# Patient Record
Sex: Female | Born: 1995
Health system: Southern US, Community
[De-identification: ages and names within clinical notes are randomized; demographics above are authoritative.]

## PROBLEM LIST (undated history)

## (undated) DIAGNOSIS — Z789 Other specified health status: Secondary | ICD-10-CM

## (undated) HISTORY — PX: NO PAST SURGERIES: SHX2092

---

## 2019-10-09 LAB — OB RESULTS CONSOLE GBS: GBS: NEGATIVE

## 2020-02-02 ENCOUNTER — Other Ambulatory Visit: Payer: Self-pay

## 2020-02-02 ENCOUNTER — Encounter (HOSPITAL_COMMUNITY): Payer: Self-pay | Admitting: Obstetrics and Gynecology

## 2020-02-02 ENCOUNTER — Inpatient Hospital Stay (HOSPITAL_COMMUNITY)
Admission: AD | Admit: 2020-02-02 | Discharge: 2020-02-02 | Disposition: A | Discharge status: Home / Self Care | Payer: Self-pay | Attending: Obstetrics and Gynecology | Admitting: Obstetrics and Gynecology

## 2020-02-02 ENCOUNTER — Inpatient Hospital Stay (HOSPITAL_COMMUNITY): Payer: Self-pay

## 2020-02-02 DIAGNOSIS — O209 Hemorrhage in early pregnancy, unspecified: Secondary | ICD-10-CM

## 2020-02-02 DIAGNOSIS — Z3A10 10 weeks gestation of pregnancy: Secondary | ICD-10-CM

## 2020-02-02 DIAGNOSIS — O208 Other hemorrhage in early pregnancy: Secondary | ICD-10-CM | POA: Insufficient documentation

## 2020-02-02 DIAGNOSIS — O26891 Other specified pregnancy related conditions, first trimester: Secondary | ICD-10-CM | POA: Insufficient documentation

## 2020-02-02 DIAGNOSIS — O200 Threatened abortion: Secondary | ICD-10-CM

## 2020-02-02 HISTORY — DX: Other specified health status: Z78.9

## 2020-02-02 LAB — URINALYSIS, ROUTINE W REFLEX MICROSCOPIC
Bilirubin Urine: NEGATIVE
Glucose, UA: NEGATIVE mg/dL
Ketones, ur: NEGATIVE mg/dL
Leukocytes,Ua: NEGATIVE
Nitrite: NEGATIVE
Protein, ur: NEGATIVE mg/dL
Specific Gravity, Urine: 1.01 (ref 1.005–1.030)
pH: 6 (ref 5.0–8.0)

## 2020-02-02 LAB — CBC
HCT: 40.2 % (ref 36.0–46.0)
Hemoglobin: 13.3 g/dL (ref 12.0–15.0)
MCH: 32 pg (ref 26.0–34.0)
MCHC: 33.1 g/dL (ref 30.0–36.0)
MCV: 96.6 fL (ref 80.0–100.0)
Platelets: 225 10*3/uL (ref 150–400)
RBC: 4.16 MIL/uL (ref 3.87–5.11)
RDW: 11.9 % (ref 11.5–15.5)
WBC: 11.7 10*3/uL — ABNORMAL HIGH (ref 4.0–10.5)
nRBC: 0 % (ref 0.0–0.2)

## 2020-02-02 LAB — ABO/RH: ABO/RH(D): O POS

## 2020-02-02 LAB — WET PREP, GENITAL
Clue Cells Wet Prep HPF POC: NONE SEEN
Sperm: NONE SEEN
Trich, Wet Prep: NONE SEEN
Yeast Wet Prep HPF POC: NONE SEEN

## 2020-02-02 LAB — HCG, QUANTITATIVE, PREGNANCY: hCG, Beta Chain, Quant, S: 23902 m[IU]/mL — ABNORMAL HIGH (ref <5)

## 2020-02-02 LAB — POCT PREGNANCY, URINE: Preg Test, Ur: POSITIVE — AB

## 2020-02-02 NOTE — MAU Provider Note (Signed)
Chief Complaint: Vaginal Bleeding   First Provider Initiated Contact with Patient 02/02/20 1901     SUBJECTIVE HPI: Melanie Maldonado is a 24 y.o. G1P0 at [redacted]w[redacted]d who presents to Maternity Admissions reporting vaginal bleeding and abdominal pain.  Symptoms started this afternoon.  Bleeding enough to wear a pad, but not saturating pads or passing clots.  Has not been seen anywhere with this pregnancy yet.  Location: abdomen Quality: cramping Severity: 8/10 on pain scale Duration: hours Timing: intermittent Modifying factors: none Associated signs and symptoms: vaginal bleeding  Past Medical History:  Diagnosis Date  . Medical history non-contributory    OB History  Gravida Para Term Preterm AB Living  1            SAB TAB Ectopic Multiple Live Births               # Outcome Date GA Lbr Len/2nd Weight Sex Delivery Anes PTL Lv  1 Current            Past Surgical History:  Procedure Laterality Date  . NO PAST SURGERIES     Social History   Socioeconomic History  . Marital status: Single    Spouse name: Not on file  . Number of children: Not on file  . Years of education: Not on file  . Highest education level: Not on file  Occupational History  . Not on file  Tobacco Use  . Smoking status: Never Smoker  . Smokeless tobacco: Never Used  Substance and Sexual Activity  . Alcohol use: Never  . Drug use: Never  . Sexual activity: Yes    Birth control/protection: None  Other Topics Concern  . Not on file  Social History Narrative  . Not on file   Social Determinants of Health   Financial Resource Strain:   . Difficulty of Paying Living Expenses:   Food Insecurity:   . Worried About Charity fundraiser in the Last Year:   . Arboriculturist in the Last Year:   Transportation Needs:   . Film/video editor (Medical):   Marland Kitchen Lack of Transportation (Non-Medical):   Physical Activity:   . Days of Exercise per Week:   . Minutes of Exercise per Session:   Stress:   . Feeling  of Stress :   Social Connections:   . Frequency of Communication with Friends and Family:   . Frequency of Social Gatherings with Friends and Family:   . Attends Religious Services:   . Active Member of Clubs or Organizations:   . Attends Archivist Meetings:   Marland Kitchen Marital Status:   Intimate Partner Violence:   . Fear of Current or Ex-Partner:   . Emotionally Abused:   Marland Kitchen Physically Abused:   . Sexually Abused:    History reviewed. No pertinent family history. No current facility-administered medications on file prior to encounter.   No current outpatient medications on file prior to encounter.   No Known Allergies  I have reviewed patient's Past Medical Hx, Surgical Hx, Family Hx, Social Hx, medications and allergies.   Review of Systems  Constitutional: Negative.   Gastrointestinal: Positive for abdominal pain. Negative for diarrhea, nausea and vomiting.  Genitourinary: Positive for vaginal bleeding.    OBJECTIVE Patient Vitals for the past 24 hrs:  BP Temp Temp src Pulse Resp SpO2 Weight  02/02/20 1749 (!) 98/50 98.2 F (36.8 C) Oral 66 15 100 % 47.7 kg   Constitutional: Well-developed, well-nourished female in no  acute distress.  Cardiovascular: normal rate & rhythm, no murmur Respiratory: normal rate and effort. Lung sounds clear throughout GI: Abd soft, non-tender, Pos BS x 4. No guarding or rebound tenderness MS: Extremities nontender, no edema, normal ROM Neurologic: Alert and oriented x 4.  GU:     SPECULUM EXAM: NEFG, small amount of dark red blood  BIMANUAL: No CMT. cervix closed; uterus normal size, no adnexal tenderness or masses.    LAB RESULTS Results for orders placed or performed during the hospital encounter of 02/02/20 (from the past 24 hour(s))  Urinalysis, Routine w reflex microscopic     Status: Abnormal   Collection Time: 02/02/20  5:31 PM  Result Value Ref Range   Color, Urine STRAW (A) YELLOW   APPearance CLEAR CLEAR   Specific  Gravity, Urine 1.010 1.005 - 1.030   pH 6.0 5.0 - 8.0   Glucose, UA NEGATIVE NEGATIVE mg/dL   Hgb urine dipstick LARGE (A) NEGATIVE   Bilirubin Urine NEGATIVE NEGATIVE   Ketones, ur NEGATIVE NEGATIVE mg/dL   Protein, ur NEGATIVE NEGATIVE mg/dL   Nitrite NEGATIVE NEGATIVE   Leukocytes,Ua NEGATIVE NEGATIVE   RBC / HPF 0-5 0 - 5 RBC/hpf   WBC, UA 0-5 0 - 5 WBC/hpf   Bacteria, UA RARE (A) NONE SEEN   Squamous Epithelial / LPF 0-5 0 - 5   Mucus PRESENT   Pregnancy, urine POC     Status: Abnormal   Collection Time: 02/02/20  5:34 PM  Result Value Ref Range   Preg Test, Ur POSITIVE (A) NEGATIVE  CBC     Status: Abnormal   Collection Time: 02/02/20  6:44 PM  Result Value Ref Range   WBC 11.7 (H) 4.0 - 10.5 K/uL   RBC 4.16 3.87 - 5.11 MIL/uL   Hemoglobin 13.3 12.0 - 15.0 g/dL   HCT 84.6 96.2 - 95.2 %   MCV 96.6 80.0 - 100.0 fL   MCH 32.0 26.0 - 34.0 pg   MCHC 33.1 30.0 - 36.0 g/dL   RDW 84.1 32.4 - 40.1 %   Platelets 225 150 - 400 K/uL   nRBC 0.0 0.0 - 0.2 %  ABO/Rh     Status: None   Collection Time: 02/02/20  6:44 PM  Result Value Ref Range   ABO/RH(D) O POS    No rh immune globuloin      NOT A RH IMMUNE GLOBULIN CANDIDATE, PT RH POSITIVE Performed at [facility], [address] [place], [place] [address]   hCG, quantitative, pregnancy     Status: Abnormal   Collection Time: 02/02/20  6:44 PM  Result Value Ref Range   hCG, Beta Chain, Quant, S 23,902 (H) <5 mIU/mL  Wet prep, genital     Status: Abnormal   Collection Time: 02/02/20  7:37 PM   Specimen: Cervix  Result Value Ref Range   Yeast Wet Prep HPF POC NONE SEEN NONE SEEN   Trich, Wet Prep NONE SEEN NONE SEEN   Clue Cells Wet Prep HPF POC NONE SEEN NONE SEEN   WBC, Wet Prep HPF POC MANY (A) NONE SEEN   Sperm NONE SEEN     IMAGING US OB LESS THAN 14 WEEKS WITH OB TRANSVAGINAL  Result Date: 02/02/2020 CLINICAL DATA:  Bleeding and pain EXAM: OBSTETRIC <14 WK [place] AND TRANSVAGINAL OB US TECHNIQUE: Both  transabdominal and transvaginal ultrasound examinations were performed for complete evaluation of the gestation as well as the maternal uterus, adnexal regions, and pelvic cul-de-sac.  Transvaginal technique was performed to assess early pregnancy. COMPARISON:  None. FINDINGS: Intrauterine gestational sac: Single Yolk sac:  Visualized Embryo:  Not visualized Cardiac Activity: Not visualized MSD: 12.8 mm   6 w   0 d Subchorionic hemorrhage:  Along the right fundal portion of the sac. Maternal uterus/adnexae: Ovaries are within normal limits. The left ovary measures 2.5 x 3 x 1.4 cm and contains corpus luteum. The right ovary measures 2.2 x 2.5 x 1.1 cm. Trace free fluid IMPRESSION: 1. Single IUP with visible gestational sac and yolk sac but no embryo. Consider follow-up ultrasound in 10-14 days to confirm viability. 2. Small subchorionic hemorrhage 3. Trace free fluid in the pelvis Electronically Signed   By: Jasmine Pang M.D.   On: 02/02/2020 20:17    MAU COURSE Orders Placed This Encounter  Procedures  . Wet prep, genital  . US OB LESS THAN 14 WEEKS WITH OB TRANSVAGINAL  . US OB Transvaginal  . Urinalysis, Routine w reflex microscopic  . CBC  . hCG, quantitative, pregnancy  . Pregnancy, urine POC  . ABO/Rh  . Discharge patient   No orders of the defined types were placed in this encounter.   MDM +UPT UA, wet prep, GC/chlamydia, CBC, ABO/Rh, quant hCG, and [place] today to rule out ectopic pregnancy which can be life threatening.   RH positive  Ultrasound shows intrauterine gestational sac with yolk sac and a small subchorionic hemorrhage Discussed with patient this pregnancy is either earlier than suspected or it is a failed pregnancy based off her gestational age by LMP.  Will get follow-up ultrasound in 7+ days to assess for growth. ASSESSMENT 1. Threatened miscarriage   2. Vaginal bleeding in pregnancy, first trimester   3. Abdominal pain during pregnancy in first trimester      PLAN Discharge home in stable condition. Bleeding precautions GC/cT pending Outpatient ultrasound ordered for viability   Follow-up Information    [facility].   Specialty: Radiology Contact information: [address] [place] [address] [phone]         Allergies as of 02/02/2020   No Known Allergies     Medication List    You have not been prescribed any medications.      Judeth Horn, NP 02/02/2020  8:40 PM

## 2020-02-02 NOTE — Discharge Instructions (Signed)
Threatened Miscarriage  A threatened miscarriage is when you have bleeding from your vagina during the first 20 weeks of pregnancy but the pregnancy does not end. Your doctor will do tests to make sure you are still pregnant. The cause of the bleeding may not be known. This condition does not mean your pregnancy will end, but it does increase the risk that it will end (complete miscarriage). Follow these instructions at home:  Get plenty of rest.  If you have bleeding in your vagina, do not have sex or use tampons.  Do not douche.  Do not smoke or use drugs.  Do not drink alcohol.  Avoid caffeine.  Keep all follow-up prenatal visits as told by your doctor. This is important. Contact a doctor if:  You have light bleeding from your vagina.  You have belly pain or cramping.  You have a fever. Get help right away if:  You have heavy bleeding from your vagina.  You have clots of blood coming from your vagina.  You pass tissue from your vagina.  You have a gush of fluid from your vagina.  You are leaking fluid from your vagina.  You have very bad pain or cramps in your low back or belly.  You have fever, chills, and very bad belly pain. Summary  A threatened miscarriage is when you have bleeding from your vagina during the first 20 weeks of pregnancy but the pregnancy does not end.  This condition does not mean your pregnancy will end, but it does increase the risk that it will end (complete miscarriage).  Get plenty of rest. If you have bleeding in your vagina, do not have sex or use tampons.  Keep all follow-up prenatal visits as told by your doctor. This is important. This information is not intended to replace advice given to you by your health care provider. Make sure you discuss any questions you have with your health care provider. Document Revised: 10/04/2017 Document Reviewed: 11/24/2016 Elsevier Patient Education  2020 Elsevier Inc.  

## 2020-02-02 NOTE — MAU Note (Signed)
Pt noticed some vaginal bleeding when she went to the bathroom a couple of hours ago and again when she used the restroom here. States it was bright red, not quite as heavy as a period. Has had lower abdominal cramping on and off for the last four days. Denies recent intercourse.

## 2020-02-03 LAB — GC/CHLAMYDIA PROBE AMP (~~LOC~~) NOT AT ARMC
Chlamydia: NEGATIVE
Comment: NEGATIVE
Comment: NORMAL
Neisseria Gonorrhea: NEGATIVE

## 2020-02-10 ENCOUNTER — Other Ambulatory Visit: Payer: Self-pay

## 2020-02-10 ENCOUNTER — Ambulatory Visit (INDEPENDENT_AMBULATORY_CARE_PROVIDER_SITE_OTHER): Payer: Self-pay | Admitting: *Deleted

## 2020-02-10 ENCOUNTER — Ambulatory Visit
Admission: RE | Admit: 2020-02-10 | Discharge: 2020-02-10 | Disposition: A | Discharge status: Home / Self Care | Payer: Self-pay | Source: Ambulatory Visit | Attending: Student | Admitting: Student

## 2020-02-10 VITALS — BP 93/60 | HR 58 | Temp 97.7°F

## 2020-02-10 DIAGNOSIS — O200 Threatened abortion: Secondary | ICD-10-CM | POA: Insufficient documentation

## 2020-02-10 DIAGNOSIS — O26891 Other specified pregnancy related conditions, first trimester: Secondary | ICD-10-CM | POA: Insufficient documentation

## 2020-02-10 DIAGNOSIS — R109 Unspecified abdominal pain: Secondary | ICD-10-CM | POA: Insufficient documentation

## 2020-02-10 DIAGNOSIS — Z712 Person consulting for explanation of examination or test findings: Secondary | ICD-10-CM

## 2020-02-10 DIAGNOSIS — O209 Hemorrhage in early pregnancy, unspecified: Secondary | ICD-10-CM | POA: Insufficient documentation

## 2020-02-10 NOTE — Progress Notes (Signed)
Pt here for [place] results. She denies having any vaginal bleeding or pain. [place] results were reviewed with Raelyn Mora, CNM. Pt informed of viable intrauterine pregnancy @ [redacted]w[redacted]d with EDD 09/30/2020.  Fetal heart rate is 127 bpm. Pictures provided by [place] dept were given to pt. Pt was advised to begin taking prenatal vitamins. Also she should return to MAU if she develops heavy vaginal bleeding or abdominal/pelvic pain. Pt voiced understanding of all information and instructions given. She will schedule prenatal care.

## 2020-02-10 NOTE — Progress Notes (Signed)
I have reviewed the chart and agree with nursing staff's documentation of this patient's encounter.  Raelyn Mora, CNM 02/10/2020 10:38 PM

## 2020-03-04 LAB — OB RESULTS CONSOLE RUBELLA ANTIBODY, IGM: Rubella: IMMUNE

## 2020-03-04 LAB — OB RESULTS CONSOLE HIV ANTIBODY (ROUTINE TESTING): HIV: NONREACTIVE

## 2020-03-04 LAB — OB RESULTS CONSOLE VARICELLA ZOSTER ANTIBODY, IGG: Varicella: NON-IMMUNE/NOT IMMUNE

## 2020-03-04 LAB — OB RESULTS CONSOLE HEPATITIS B SURFACE ANTIGEN: Hepatitis B Surface Ag: NEGATIVE

## 2020-03-04 LAB — HEPATITIS C ANTIBODY: HCV Ab: NEGATIVE

## 2020-03-05 ENCOUNTER — Other Ambulatory Visit: Payer: Self-pay | Admitting: Obstetrics & Gynecology

## 2020-03-05 DIAGNOSIS — Z369 Encounter for antenatal screening, unspecified: Secondary | ICD-10-CM

## 2020-03-25 ENCOUNTER — Ambulatory Visit: Payer: Self-pay | Attending: Obstetrics and Gynecology

## 2020-03-25 ENCOUNTER — Ambulatory Visit: Payer: Self-pay

## 2020-03-25 ENCOUNTER — Other Ambulatory Visit: Payer: Self-pay

## 2020-03-25 ENCOUNTER — Ambulatory Visit: Payer: Self-pay | Admitting: *Deleted

## 2020-03-25 VITALS — BP 100/58 | HR 91 | Ht 59.0 in | Wt 104.0 lb

## 2020-03-25 DIAGNOSIS — Z3682 Encounter for antenatal screening for nuchal translucency: Secondary | ICD-10-CM

## 2020-03-25 DIAGNOSIS — Z369 Encounter for antenatal screening, unspecified: Secondary | ICD-10-CM | POA: Insufficient documentation

## 2020-03-25 DIAGNOSIS — Z3A13 13 weeks gestation of pregnancy: Secondary | ICD-10-CM

## 2020-03-27 LAB — FIRST TRIMESTER SCREEN W/NT
CRL: 72.1 mm
DIA MoM: 1.24
DIA Value: 326.7 pg/mL
Gest Age-Collect: 13.1 weeks
Maternal Age At EDD: 26.2 yr
Nuchal Translucency MoM: 0.76
Nuchal Translucency: 1.2 mm
Number of Fetuses: 1
PAPP-A MoM: 1.47
PAPP-A Value: 2832.5 ng/mL
Test Results:: NEGATIVE
Weight: 104 [lb_av]
hCG MoM: 0.74
hCG Value: 79.3 IU/mL

## 2020-03-30 ENCOUNTER — Telehealth: Payer: Self-pay | Admitting: Genetic Counselor

## 2020-03-30 NOTE — Telephone Encounter (Signed)
LVM for Ms. Christians re: good news about screening results. Requested a call back to my direct line to discuss these in more detail, as no identifiers were provided in voicemail message.   Gershon Crane, MS, [facility] Genetic Counselor

## 2020-04-08 ENCOUNTER — Telehealth: Payer: Self-pay | Admitting: Genetic Counselor

## 2020-04-08 NOTE — Telephone Encounter (Signed)
Attempted to call Ms. Vadala again to discuss her negative first trimester screening results; however, I still did not receive a response. Left another voicemail informing her that I was calling with good news about her screening results and requested a call back to my direct line to discuss these in more detail.   Gershon Crane, MS, [facility] Genetic Counselor

## 2020-08-10 LAB — OB RESULTS CONSOLE HIV ANTIBODY (ROUTINE TESTING): HIV: NONREACTIVE

## 2020-08-10 LAB — OB RESULTS CONSOLE RPR: RPR: NONREACTIVE

## 2020-09-11 NOTE — L&D Delivery Note (Signed)
Delivery Note Melanie Maldonado is a 26 y.o. G1P0 at [redacted]w[redacted]d admitted for IOL d/t decreased fm.   GBS Status:  Negative/-- (01/28 0000) Maximum Maternal Temperature: 98.6  Labor course: Initial SVE: 1/70/-2. Augmentation with: Cytotec. She then progressed to complete.  ROM: 10h 33m with light mec fluid  Birth: At 2226 a viable female was delivered via spontaneous vaginal delivery (Presentation: LOA restituted to ROA ). Nuchal cord present: No.  Shoulders and body delivered in usual fashion. Infant placed directly on mom's abdomen for bonding/skin-to-skin, baby dried and stimulated. Cord clamped x 2 after 1 minute and cut by pt.  Cord blood collected.  The placenta separated spontaneously and delivered via gentle cord traction.  Pitocin infused rapidly IV per protocol.  Fundus firm with massage.  Placenta inspected and appears to be intact with a 3 VC.  Placenta/Cord with the following complications: marginal insertion .  Cord pH: not done Sponge and instrument count were correct x2.  Intrapartum complications:  None Anesthesia:  epidural Episiotomy: none Lacerations:  1st degree and and Rt labial, repaired for hemostasis Suture Repair: 3.0 vicryl rapide & 4.0 vicryl EBL (mL): 100   Infant: APGAR (1 MIN): 8   APGAR (5 MINS): 9   APGAR (10 MINS):    Infant weight: pending  Mom to postpartum.  Baby to Couplet care / Skin to Skin. Placenta to L&D   Plans to Breastfeed Contraception: outpatient Nexplanon Circumcision: N/A  Note sent to [facility]: N/A, [facility] pt   Cheral Marker CNM, [facility] 10/05/2020 10:54 PM

## 2020-10-05 ENCOUNTER — Inpatient Hospital Stay (HOSPITAL_COMMUNITY): Payer: Medicaid Other | Admitting: Anesthesiology

## 2020-10-05 ENCOUNTER — Inpatient Hospital Stay (HOSPITAL_COMMUNITY)
Admission: AD | Admit: 2020-10-05 | Discharge: 2020-10-07 | DRG: 805 | Disposition: A | Discharge status: Home / Self Care | Payer: Medicaid Other | Attending: Family Medicine | Admitting: Family Medicine

## 2020-10-05 ENCOUNTER — Encounter (HOSPITAL_COMMUNITY): Payer: Self-pay | Admitting: Obstetrics and Gynecology

## 2020-10-05 ENCOUNTER — Other Ambulatory Visit: Payer: Self-pay

## 2020-10-05 DIAGNOSIS — O368130 Decreased fetal movements, third trimester, not applicable or unspecified: Principal | ICD-10-CM | POA: Diagnosis present

## 2020-10-05 DIAGNOSIS — Z8759 Personal history of other complications of pregnancy, childbirth and the puerperium: Secondary | ICD-10-CM | POA: Diagnosis not present

## 2020-10-05 DIAGNOSIS — O43123 Velamentous insertion of umbilical cord, third trimester: Secondary | ICD-10-CM | POA: Diagnosis present

## 2020-10-05 DIAGNOSIS — O9852 Other viral diseases complicating childbirth: Secondary | ICD-10-CM | POA: Diagnosis present

## 2020-10-05 DIAGNOSIS — Z3A40 40 weeks gestation of pregnancy: Secondary | ICD-10-CM | POA: Diagnosis not present

## 2020-10-05 DIAGNOSIS — O9853 Other viral diseases complicating the puerperium: Secondary | ICD-10-CM | POA: Diagnosis not present

## 2020-10-05 DIAGNOSIS — U071 COVID-19: Secondary | ICD-10-CM | POA: Diagnosis present

## 2020-10-05 DIAGNOSIS — Z3400 Encounter for supervision of normal first pregnancy, unspecified trimester: Secondary | ICD-10-CM

## 2020-10-05 HISTORY — DX: Encounter for supervision of normal first pregnancy, unspecified trimester: Z34.00

## 2020-10-05 LAB — CBC
HCT: 43.5 % (ref 36.0–46.0)
Hemoglobin: 15 g/dL (ref 12.0–15.0)
MCH: 33.3 pg (ref 26.0–34.0)
MCHC: 34.5 g/dL (ref 30.0–36.0)
MCV: 96.7 fL (ref 80.0–100.0)
Platelets: 179 10*3/uL (ref 150–400)
RBC: 4.5 MIL/uL (ref 3.87–5.11)
RDW: 12.9 % (ref 11.5–15.5)
WBC: 7.7 10*3/uL (ref 4.0–10.5)
nRBC: 0 % (ref 0.0–0.2)

## 2020-10-05 LAB — TYPE AND SCREEN
ABO/RH(D): O POS
Antibody Screen: NEGATIVE

## 2020-10-05 LAB — RPR: RPR Ser Ql: NONREACTIVE

## 2020-10-05 LAB — SARS CORONAVIRUS 2 BY RT PCR (HOSPITAL ORDER, PERFORMED IN ~~LOC~~ HOSPITAL LAB): SARS Coronavirus 2: POSITIVE — AB

## 2020-10-05 MED ORDER — ACETAMINOPHEN 325 MG PO TABS: 650.0000 mg | ORAL_TABLET | ORAL | Status: DC | PRN

## 2020-10-05 MED ORDER — PHENYLEPHRINE 40 MCG/ML (10ML) SYRINGE FOR IV PUSH (FOR BLOOD PRESSURE SUPPORT): 80.0000 ug | PREFILLED_SYRINGE | INTRAVENOUS | Status: DC | PRN

## 2020-10-05 MED ORDER — DIPHENHYDRAMINE HCL 50 MG/ML IJ SOLN: 12.5000 mg | INTRAMUSCULAR | Status: DC | PRN

## 2020-10-05 MED ORDER — EPHEDRINE 5 MG/ML INJ: 10.0000 mg | INTRAVENOUS | Status: DC | PRN

## 2020-10-05 MED ORDER — MISOPROSTOL 50MCG HALF TABLET: 50.0000 ug | ORAL_TABLET | ORAL | Status: DC | PRN

## 2020-10-05 MED ORDER — FENTANYL CITRATE (PF) 100 MCG/2ML IJ SOLN
INTRAMUSCULAR | Status: AC
  Administered 2020-10-05: 100 ug
  Filled 2020-10-05: qty 2

## 2020-10-05 MED ORDER — LACTATED RINGERS IV SOLN
INTRAVENOUS | Status: DC
  Administered 2020-10-05: 125 mL/h via INTRAVENOUS

## 2020-10-05 MED ORDER — LIDOCAINE HCL (PF) 1 % IJ SOLN
INTRAMUSCULAR | Status: DC | PRN
  Administered 2020-10-05: 11 mL via EPIDURAL

## 2020-10-05 MED ORDER — ONDANSETRON HCL 4 MG/2ML IJ SOLN: 4.0000 mg | Freq: Four times a day (QID) | INTRAMUSCULAR | Status: DC | PRN

## 2020-10-05 MED ORDER — TERBUTALINE SULFATE 1 MG/ML IJ SOLN: 0.2500 mg | Freq: Once | INTRAMUSCULAR | Status: DC | PRN

## 2020-10-05 MED ORDER — LIDOCAINE HCL (PF) 1 % IJ SOLN: 30.0000 mL | INTRAMUSCULAR | Status: DC | PRN

## 2020-10-05 MED ORDER — SOD CITRATE-CITRIC ACID 500-334 MG/5ML PO SOLN: 30.0000 mL | ORAL | Status: DC | PRN

## 2020-10-05 MED ORDER — OXYTOCIN-SODIUM CHLORIDE 30-0.9 UT/500ML-% IV SOLN
1.0000 m[IU]/min | INTRAVENOUS | Status: DC
  Filled 2020-10-05: qty 500

## 2020-10-05 MED ORDER — LACTATED RINGERS IV SOLN
500.0000 mL | Freq: Once | INTRAVENOUS | Status: AC
  Administered 2020-10-05: 500 mL via INTRAVENOUS

## 2020-10-05 MED ORDER — OXYTOCIN-SODIUM CHLORIDE 30-0.9 UT/500ML-% IV SOLN
2.5000 [IU]/h | INTRAVENOUS | Status: DC
  Administered 2020-10-05: 2.5 [IU]/h via INTRAVENOUS

## 2020-10-05 MED ORDER — OXYCODONE-ACETAMINOPHEN 5-325 MG PO TABS: 1.0000 | ORAL_TABLET | ORAL | Status: DC | PRN

## 2020-10-05 MED ORDER — OXYCODONE-ACETAMINOPHEN 5-325 MG PO TABS: 2.0000 | ORAL_TABLET | ORAL | Status: DC | PRN

## 2020-10-05 MED ORDER — MISOPROSTOL 50MCG HALF TABLET
ORAL_TABLET | ORAL | Status: AC
  Administered 2020-10-05: 50 ug
  Filled 2020-10-05: qty 1

## 2020-10-05 MED ORDER — FENTANYL-BUPIVACAINE-NACL 0.5-0.125-0.9 MG/250ML-% EP SOLN
12.0000 mL/h | EPIDURAL | Status: DC | PRN
  Administered 2020-10-05: 12 mL/h via EPIDURAL
  Filled 2020-10-05: qty 250

## 2020-10-05 MED ORDER — OXYTOCIN BOLUS FROM INFUSION
333.0000 mL | Freq: Once | INTRAVENOUS | Status: AC
  Administered 2020-10-05: 333 mL via INTRAVENOUS

## 2020-10-05 MED ORDER — FENTANYL CITRATE (PF) 100 MCG/2ML IJ SOLN: 100.0000 ug | INTRAMUSCULAR | Status: DC | PRN

## 2020-10-05 MED ORDER — LACTATED RINGERS IV SOLN: 500.0000 mL | INTRAVENOUS | Status: DC | PRN

## 2020-10-05 NOTE — Progress Notes (Signed)
LABOR PROGRESS NOTE  Melanie Maldonado is a 25 y.o. G1P0 at [redacted]w[redacted]d  admitted for IOL for decreased FM at term.   Subjective: Strip note.   Objective: BP 90/60   Pulse 66   Temp 97.8 F (36.6 C) (Oral)   Resp 18   Ht 5\' 2"  (1.575 m)   Wt 61.8 kg   LMP 11/24/2019 (Approximate)   SpO2 100%   BMI 24.91 kg/m  or  Vitals:   10/05/20 1546 10/05/20 1550 10/05/20 1555 10/05/20 1600  BP: 95/67 (!) 96/56 (!) 95/59 90/60  Pulse: 62 (!) 58 64 66  Resp:      Temp:      TempSrc:      SpO2:      Weight:      Height:        Dilation: 6.5 Effacement (%): 100 Cervical Position: Middle Station: -1 Presentation: Vertex Exam by:: m wilkins rnc FHT: baseline rate 120, moderate varibility, + acel, no decel Toco: q2-3 min   Labs: Lab Results  Component Value Date   WBC 7.7 10/05/2020   HGB 15.0 10/05/2020   HCT 43.5 10/05/2020   MCV 96.7 10/05/2020   PLT 179 10/05/2020    Patient Active Problem List   Diagnosis Date Noted  . Supervision of low-risk first pregnancy 10/05/2020    Assessment / Plan: 25 y.o. G1P0 at [redacted]w[redacted]d here for IOL for decreased fetal movement at term. Found to be COVID+ at admit.   Labor: S/p cytotec x1. Subsequently had SROM (light mec) with adequate contraction pattern and has been progressing without augmentation.  Fetal Wellbeing:  Cat I  Pain Control:  Epidural in place  Anticipated MOD:  NSVD  [redacted]w[redacted]d, D.O. OB Fellow  10/05/2020, 4:14 PM

## 2020-10-05 NOTE — Anesthesia Preprocedure Evaluation (Signed)
Anesthesia Evaluation  Patient identified by MRN, date of birth, ID band Patient awake    Reviewed: Allergy & Precautions, H&P , NPO status , Patient's Chart, lab work & pertinent test results  Airway Mallampati: II  TM Distance: >3 FB Neck ROM: Full    Dental no notable dental hx.    Pulmonary neg pulmonary ROS,    Pulmonary exam normal breath sounds clear to auscultation       Cardiovascular negative cardio ROS Normal cardiovascular exam Rhythm:Regular Rate:Normal     Neuro/Psych negative neurological ROS  negative psych ROS   GI/Hepatic negative GI ROS, Neg liver ROS,   Endo/Other  negative endocrine ROS  Renal/GU negative Renal ROS  negative genitourinary   Musculoskeletal negative musculoskeletal ROS (+)   Abdominal   Peds negative pediatric ROS (+)  Hematology negative hematology ROS (+)   Anesthesia Other Findings Covid +  Reproductive/Obstetrics (+) Pregnancy                             Anesthesia Physical Anesthesia Plan  ASA: II  Anesthesia Plan: Epidural   Post-op Pain Management:    Induction:   PONV Risk Score and Plan:   Airway Management Planned:   Additional Equipment:   Intra-op Plan:   Post-operative Plan:   Informed Consent:   Plan Discussed with:   Anesthesia Plan Comments:         Anesthesia Quick Evaluation

## 2020-10-05 NOTE — Discharge Summary (Addendum)
Postpartum Discharge Summary  Date of Service updated 10/07/20    Patient Name: Melanie Maldonado  DOB: [birth year] MRN: [id number]  Date of admission: 10/05/2020 Delivery date:10/05/2020  Delivering provider: Roma Schanz  Date of discharge: 10/07/2020  Admitting diagnosis: Supervision of low-risk first pregnancy [Z34.00] Intrauterine pregnancy: [redacted]w[redacted]d    Secondary diagnosis:  Active Problems:   Supervision of low-risk first pregnancy  Additional problems: Covid+ asymptomatic    Discharge diagnosis: Term Pregnancy Delivered                                              Post partum procedures:none Augmentation: Cytotec Complications: None  Hospital course: Induction of Labor With Vaginal Delivery   25y.o. yo G1P0 at 474w5das admitted to the hospital 10/05/2020 for induction of labor.  Indication for induction: decreased fetal movement.  Patient had an uncomplicated labor course as follows: Membrane Rupture Time/Date: 12:00 PM ,10/05/2020   Delivery Method:Vaginal, Spontaneous  Episiotomy: None  Lacerations:  Labial;1st degree;Perineal  Details of delivery can be found in separate delivery note.  Patient had a routine postpartum course. Patient is discharged home 10/07/20.  Newborn Data: Birth date:10/05/2020  Birth time:10:26 PM  Gender:Female  Living status:Living  Apgars:8 ,9  Weight:2860 g   Magnesium Sulfate received: No BMZ received: No Rhophylac:N/A MMR:N/A T-DaP:Given prenatally Flu: given prenatally Transfusion:No  Physical exam  Vitals:   10/06/20 0444 10/06/20 0907 10/06/20 1430 10/06/20 2010  BP: 97/64 98/64 94/63  (!) 90/59  Pulse: 70 60 (!) 54 62  Resp: 18 16 16 17   Temp: 98.2 F (36.8 C) 98 F (36.7 C) 98.1 F (36.7 C) (!) 97.5 F (36.4 C)  TempSrc: Oral Oral Oral Oral  SpO2: 100% 99% 100% 100%  Weight:      Height:       General: alert, cooperative and no distress Lochia: appropriate Uterine Fundus: firm Incision: N/A DVT Evaluation: No  evidence of DVT seen on physical exam. Labs: Lab Results  Component Value Date   WBC 7.7 10/05/2020   HGB 15.0 10/05/2020   HCT 43.5 10/05/2020   MCV 96.7 10/05/2020   PLT 179 10/05/2020   No flowsheet data found. Edinburgh Score: Edinburgh Postnatal Depression Scale Screening Tool 10/06/2020  I have been able to laugh and see the funny side of things. 0  I have looked forward with enjoyment to things. 0  I have blamed myself unnecessarily when things went wrong. 2  I have been anxious or worried for no good reason. 1  I have felt scared or panicky for no good reason. 1  Things have been getting on top of me. 1  I have been so unhappy that I have had difficulty sleeping. 0  I have felt sad or miserable. 0  I have been so unhappy that I have been crying. 0  The thought of harming myself has occurred to me. 0  Edinburgh Postnatal Depression Scale Total 5     After visit meds:  Allergies as of 10/07/2020   No Known Allergies     Medication List    TAKE these medications   acetaminophen 325 MG tablet Commonly known as: Tylenol Take 2 tablets (650 mg total) by mouth every 4 (four) hours as needed (for pain scale < 4).   ibuprofen 600 MG tablet Commonly known as: ADVIL Take 1 tablet (  600 mg total) by mouth every 6 (six) hours.   PRENATAL VITAMIN PO Take by mouth.        Discharge home in stable condition Infant Feeding: Breast Infant Disposition:home with mother Discharge instruction: per After Visit Summary and Postpartum booklet. Activity: Advance as tolerated. Pelvic rest for 6 weeks.  Diet: routine diet Future Appointments:No future appointments.  Follow up Visit: make appt at 6 weeks at [facility]   10/07/2020 Gladys Damme, MD  I saw and evaluated the patient. I agree with the findings and the plan of care as documented in the resident's note.  Sharene Skeans, MD [facility] Family Medicine Fellow, [facility] for [facility], [place]

## 2020-10-05 NOTE — H&P (Addendum)
OBSTETRIC ADMISSION HISTORY AND PHYSICAL  Melanie Maldonado is a 25 y.o. female G1P0 with IUP at [redacted]w[redacted]d by L/6 presenting for IOL secondary to DFM since early this morning. She reports No LOF, no VB, no blurry vision, headaches or peripheral edema, and RUQ pain.  She plans on breast and bottle feeding. She is undecided for birth control.  She received her prenatal care at [facility]  Dating: By L/6 --->  Estimated Date of Delivery: 09/30/20  Sono:  @[redacted]w[redacted]d , CWD, normal anatomy  Prenatal History/Complications: COVID+ at admission   Past Medical History: Past Medical History:  Diagnosis Date  . Medical history non-contributory     Past Surgical History: Past Surgical History:  Procedure Laterality Date  . NO PAST SURGERIES      Obstetrical History: OB History    Gravida  1   Para      Term      Preterm      AB      Living        SAB      IAB      Ectopic      Multiple      Live Births              Social History Social History   Socioeconomic History  . Marital status: Single    Spouse name: Not on file  . Number of children: Not on file  . Years of education: Not on file  . Highest education level: Not on file  Occupational History  . Not on file  Tobacco Use  . Smoking status: Never Smoker  . Smokeless tobacco: Never Used  Vaping Use  . Vaping Use: Never used  Substance and Sexual Activity  . Alcohol use: Never  . Drug use: Never  . Sexual activity: Yes    Birth control/protection: None  Other Topics Concern  . Not on file  Social History Narrative  . Not on file   Social Determinants of Health   Financial Resource Strain: Not on file  Food Insecurity: Not on file  Transportation Needs: Not on file  Physical Activity: Not on file  Stress: Not on file  Social Connections: Not on file    Family History: History reviewed. No pertinent family history.  Allergies: No Known Allergies  Medications Prior to Admission  Medication Sig Dispense  Refill Last Dose  . Prenatal Vit-Fe Fumarate-FA (PRENATAL VITAMIN PO) Take by mouth.   10/04/2020 at Unknown time     Review of Systems   All systems reviewed and negative except as stated in HPI  Blood pressure 97/60, pulse 75, temperature 97.8 F (36.6 C), temperature source Oral, resp. rate 18, height 5\' 2"  (1.575 m), weight 61.8 kg, last menstrual period 11/24/2019, SpO2 100 %. General appearance: alert, cooperative and appears stated age Lungs: normal WOB Heart: regular rate Abdomen: soft, non-tender Extremities: no sign of DVT Presentation: cephalic Fetal monitoringBaseline: 130 bpm, Variability: Good {> 6 bpm), Accelerations: Reactive and Decelerations: Absent Uterine activity: irregular  Dilation: 4 Effacement (%): 100 Station: -2 Exam by:: m wilkins rnc   Prenatal labs: ABO, Rh: --/--/O POS (01/25 0830) Antibody: NEG (01/25 0830)negative Rubella:  immune RPR: NON REACTIVE (01/25 0830) nonreactive HBsAg:   nonreactive HIV:   nonreactive GBS:   negative 1 hr Glucola 92 Genetic screening: normal quad screen Anatomy 02-10-1984 wnl  Prenatal Transfer Tool  Maternal Diabetes: No Genetic Screening: Normal Maternal Ultrasounds/Referrals: Normal Fetal Ultrasounds or other Referrals:  None Maternal Substance  Abuse:  No Significant Maternal Medications:  None Significant Maternal Lab Results: Group B Strep negative  Results for orders placed or performed during the hospital encounter of 10/05/20 (from the past 24 hour(s))  SARS Coronavirus 2 by RT PCR (hospital order, performed in [facility] hospital lab) Nasopharyngeal Nasopharyngeal Swab   Collection Time: 10/05/20  8:30 AM   Specimen: Nasopharyngeal Swab  Result Value Ref Range   SARS Coronavirus 2 POSITIVE (A) NEGATIVE  CBC   Collection Time: 10/05/20  8:30 AM  Result Value Ref Range   WBC 7.7 4.0 - 10.5 K/uL   RBC 4.50 3.87 - 5.11 MIL/uL   Hemoglobin 15.0 12.0 - 15.0 g/dL   HCT 82.9 56.2 - 13.0 %   MCV 96.7 80.0  - 100.0 fL   MCH 33.3 26.0 - 34.0 pg   MCHC 34.5 30.0 - 36.0 g/dL   RDW 86.5 78.4 - 69.6 %   Platelets 179 150 - 400 K/uL   nRBC 0.0 0.0 - 0.2 %  RPR   Collection Time: 10/05/20  8:30 AM  Result Value Ref Range   RPR Ser Ql NON REACTIVE NON REACTIVE  Type and screen [facility]   Collection Time: 10/05/20  8:30 AM  Result Value Ref Range   ABO/RH(D) O POS    Antibody Screen NEG    Sample Expiration      10/08/2020,2359 Performed at [facility], [address] [place], [place] [address]     Patient Active Problem List   Diagnosis Date Noted  . Supervision of low-risk first pregnancy 10/05/2020    Assessment/Plan:  Melanie Maldonado is a 25 y.o. G1P0 at [redacted]w[redacted]d here for IOL secondary to post-dates DFM.  #Labor:Start with Cytotec q4h prn. On repeat check found to be 4cm and ruptured with light mec.  #Pain: TBD. Discussed options with patient.   #FWB: Category 1 strip #ID: GBS negative #MOF: breast & bottle #MOC: undecided; counseled on admission #Circ:  N/A  De Hollingshead, DO  10/05/2020, 12:51 PM

## 2020-10-05 NOTE — Anesthesia Procedure Notes (Signed)
Epidural Patient location during procedure: OB Start time: 10/05/2020 3:20 PM End time: 10/05/2020 3:34 PM  Staffing Anesthesiologist: Lowella Curb, MD Performed: anesthesiologist   Preanesthetic Checklist Completed: patient identified, IV checked, site marked, risks and benefits discussed, surgical consent, monitors and equipment checked, pre-op evaluation and timeout performed  Epidural Patient position: sitting Prep: ChloraPrep Patient monitoring: heart rate, cardiac monitor, continuous pulse ox and blood pressure Approach: midline Location: L2-L3 Injection technique: LOR saline  Needle:  Needle type: Tuohy  Needle gauge: 17 G Needle length: 9 cm Needle insertion depth: 4 cm Catheter type: closed end flexible Catheter size: 20 Guage Catheter at skin depth: 8 cm Test dose: negative  Assessment Events: blood not aspirated, injection not painful, no injection resistance, no paresthesia and negative IV test  Additional Notes Epidural placed by SRNA under direct supervisionReason for block:procedure for pain

## 2020-10-05 NOTE — Lactation Note (Signed)
Lactation Consultation Note Baby less than 1 hrs old.  Mom had all ready latched some before LC came into room shallow latch. + Covid mom  Assisted in deeper latch, encouraged cheeks to breast, chin tug to widen flange. Assisted STS, baby resting on top of mom's gown.  Mom has good everted nipples. Mom sleepy. Praised mom for good feeding.    Patient Name: Melanie Maldonado [id number] Date: 10/05/2020 Reason for consult: L&D Initial assessment;Primapara;Term Age:25 y.o.  Maternal Data Has patient been taught Hand Expression?: Yes Does the patient have breastfeeding experience prior to this delivery?: No  Feeding Feeding Type: Breast Fed  LATCH Score Latch: Grasps breast easily, tongue down, lips flanged, rhythmical sucking.  Audible Swallowing: None  Type of Nipple: Everted at rest and after stimulation  Comfort (Breast/Nipple): Soft / non-tender  Hold (Positioning): Assistance needed to correctly position infant at breast and maintain latch.  LATCH Score: 7  Interventions Interventions: Breast feeding basics reviewed;Support pillows;Assisted with latch;Skin to skin  Lactation Tools Discussed/Used     Consult Status Consult Status: Follow-up Date: 10/06/20 Follow-up type: In-patient    Charyl Dancer 10/05/2020, 11:34 PM

## 2020-10-05 NOTE — Progress Notes (Signed)
Patient ID: Melanie Maldonado, female   DOB: 1996-05-09, 25 y.o.   MRN: [id number] Melanie Maldonado is a 25 y.o. G1P0 at [redacted]w[redacted]d admitted for induction of labor due to decreased FM  Subjective: comfortable with epidural, vomited x 1. No pressure/urge to push  Objective: BP 103/76   Pulse 78   Temp 98.4 F (36.9 C) (Oral)   Resp 16   Ht 5\' 2"  (1.575 m)   Wt 61.8 kg   LMP 11/24/2019 (Approximate)   SpO2 100%   BMI 24.91 kg/m  No intake/output data recorded.  FHT:  FHR: 135 bpm, variability: moderate,  accelerations:  Present,  decelerations:  Absent UC:   regular, every 2-4 minutes  SVE:   10/100/+2  Labs: Lab Results  Component Value Date   WBC 7.7 10/05/2020   HGB 15.0 10/05/2020   HCT 43.5 10/05/2020   MCV 96.7 10/05/2020   PLT 179 10/05/2020    Assessment / Plan: IOL d/t decreased fetal movement. S/P cytotec x 1, SROM @ 1200, and has progressed normally on her own. Now complete/+2, will begin to push  Labor: active Fetal Wellbeing:  Category I Pain Control:  Epidural Pre-eclampsia: N/A I/D:  GBS neg Anticipated MOD: NSVB  10/07/2020 CNM, WHNP-BC 10/05/2020, 8:54 PM

## 2020-10-05 NOTE — MAU Note (Signed)
..  Melanie Maldonado is a 25 y.o. at [redacted]w[redacted]d here in MAU reporting: Contractions since 4 am and are now every 5-10 minutes. Reports some bloody show. Denies leaking of fluid. Reports decreased fetal movement the past two hours.  Pain score: 6/10 Vitals:   10/05/20 0646  BP: 99/60  Pulse: 89  Resp: 17  Temp: 98.6 F (37 C)  SpO2: 100%     FHT:125

## 2020-10-06 MED ORDER — SODIUM CHLORIDE 0.9% FLUSH: 3.0000 mL | Freq: Two times a day (BID) | INTRAVENOUS | Status: DC

## 2020-10-06 MED ORDER — ONDANSETRON HCL 4 MG PO TABS: 4.0000 mg | ORAL_TABLET | ORAL | Status: DC | PRN

## 2020-10-06 MED ORDER — ACETAMINOPHEN 325 MG PO TABS: 650.0000 mg | ORAL_TABLET | ORAL | Status: DC | PRN

## 2020-10-06 MED ORDER — SIMETHICONE 80 MG PO CHEW: 80.0000 mg | CHEWABLE_TABLET | ORAL | Status: DC | PRN

## 2020-10-06 MED ORDER — BENZOCAINE-MENTHOL 20-0.5 % EX AERO
1.0000 "application " | INHALATION_SPRAY | CUTANEOUS | Status: DC | PRN
  Administered 2020-10-06: 1 via TOPICAL
  Filled 2020-10-06 (×2): qty 56

## 2020-10-06 MED ORDER — WITCH HAZEL-GLYCERIN EX PADS: 1.0000 "application " | MEDICATED_PAD | CUTANEOUS | Status: DC | PRN

## 2020-10-06 MED ORDER — IBUPROFEN 600 MG PO TABS
600.0000 mg | ORAL_TABLET | Freq: Four times a day (QID) | ORAL | Status: DC
  Administered 2020-10-06 – 2020-10-07 (×5): 600 mg via ORAL
  Filled 2020-10-06 (×7): qty 1

## 2020-10-06 MED ORDER — SENNOSIDES-DOCUSATE SODIUM 8.6-50 MG PO TABS
2.0000 | ORAL_TABLET | Freq: Every day | ORAL | Status: DC
  Administered 2020-10-06: 2 via ORAL
  Filled 2020-10-06: qty 2

## 2020-10-06 MED ORDER — MEASLES, MUMPS & RUBELLA VAC IJ SOLR: 0.5000 mL | Freq: Once | INTRAMUSCULAR | Status: DC

## 2020-10-06 MED ORDER — SODIUM CHLORIDE 0.9 % IV SOLN: 250.0000 mL | INTRAVENOUS | Status: DC | PRN

## 2020-10-06 MED ORDER — ZOLPIDEM TARTRATE 5 MG PO TABS: 5.0000 mg | ORAL_TABLET | Freq: Every evening | ORAL | Status: DC | PRN

## 2020-10-06 MED ORDER — DIBUCAINE (PERIANAL) 1 % EX OINT: 1.0000 "application " | TOPICAL_OINTMENT | CUTANEOUS | Status: DC | PRN

## 2020-10-06 MED ORDER — SODIUM CHLORIDE 0.9% FLUSH: 3.0000 mL | INTRAVENOUS | Status: DC | PRN

## 2020-10-06 MED ORDER — TETANUS-DIPHTH-ACELL PERTUSSIS 5-2.5-18.5 LF-MCG/0.5 IM SUSY: 0.5000 mL | PREFILLED_SYRINGE | Freq: Once | INTRAMUSCULAR | Status: DC

## 2020-10-06 MED ORDER — COCONUT OIL OIL
1.0000 "application " | TOPICAL_OIL | Status: DC | PRN
  Administered 2020-10-06: 1 via TOPICAL

## 2020-10-06 MED ORDER — BISACODYL 10 MG RE SUPP: 10.0000 mg | Freq: Every day | RECTAL | Status: DC | PRN

## 2020-10-06 MED ORDER — DIPHENHYDRAMINE HCL 25 MG PO CAPS: 25.0000 mg | ORAL_CAPSULE | Freq: Four times a day (QID) | ORAL | Status: DC | PRN

## 2020-10-06 MED ORDER — PRENATAL MULTIVITAMIN CH
1.0000 | ORAL_TABLET | Freq: Every day | ORAL | Status: DC
  Administered 2020-10-06 – 2020-10-07 (×2): 1 via ORAL
  Filled 2020-10-06 (×2): qty 1

## 2020-10-06 MED ORDER — FLEET ENEMA 7-19 GM/118ML RE ENEM: 1.0000 | ENEMA | Freq: Every day | RECTAL | Status: DC | PRN

## 2020-10-06 MED ORDER — ONDANSETRON HCL 4 MG/2ML IJ SOLN: 4.0000 mg | INTRAMUSCULAR | Status: DC | PRN

## 2020-10-06 NOTE — Lactation Note (Signed)
This note was copied from a baby's chart. Lactation Consultation Note Baby 7 hrs  Mom attempted to latch baby when LC came into rm. Mom stated it hurts. Baby was just on half of nipple. Baby wasn't close enough for deep latch. Baby swaddled in 2 blankets. Un-swaddled baby and latched in football position. Encouraged cheeks to breast. Mom worried baby can't breathe. Showed mom baby can breathe from sides of nose. Mom stated much better.  Mom has long everted nipples. Skin appears intact. Hand expression discussed and demonstrated. Mom happy. Newborn behavior, STS, breast massage, I&O, positioning, support, supply and demand discussed. Mom encouraged to feed baby 8-12 times/24 hours and with feeding cues.  encouraged mom to call for assistance or questions. Lactation brochure given.\   Patient Name: Melanie Maldonado PIRJJ'O Date: 10/06/2020 Reason for consult: Initial assessment;Primapara;Term Age:27 hours  Maternal Data Has patient been taught Hand Expression?: Yes Does the patient have breastfeeding experience prior to this delivery?: No  Feeding Feeding Type: Breast Fed  LATCH Score Latch: Grasps breast easily, tongue down, lips flanged, rhythmical sucking.  Audible Swallowing: A few with stimulation  Type of Nipple: Everted at rest and after stimulation  Comfort (Breast/Nipple): Filling, red/small blisters or bruises, mild/mod discomfort (sore)  Hold (Positioning): Assistance needed to correctly position infant at breast and maintain latch.  LATCH Score: 7  Interventions Interventions: Breast feeding basics reviewed;Adjust position;Assisted with latch;Support pillows;Skin to skin;Position options;Breast massage;Hand express;Breast compression  Lactation Tools Discussed/Used WIC Program: No   Consult Status Consult Status: Follow-up Date: 10/07/20 Follow-up type: In-patient    Charyl Dancer 10/06/2020, 6:32 AM

## 2020-10-06 NOTE — Anesthesia Postprocedure Evaluation (Signed)
Anesthesia Post Note  Patient: Melanie Maldonado  Procedure(s) Performed: AN AD HOC LABOR EPIDURAL     Patient location during evaluation: Mother Baby Anesthesia Type: Epidural Level of consciousness: awake Pain management: satisfactory to patient Vital Signs Assessment: post-procedure vital signs reviewed and stable Respiratory status: spontaneous breathing Cardiovascular status: stable Anesthetic complications: no   No complications documented.  Last Vitals:  Vitals:   10/06/20 0444 10/06/20 0907  BP: 97/64 98/64  Pulse: 70 60  Resp: 18 16  Temp: 36.8 C 36.7 C  SpO2: 100%     Last Pain:  Vitals:   10/06/20 0907  TempSrc: Oral  PainSc:    Pain Goal: Patients Stated Pain Goal: 0 (10/05/20 6195)                 Cephus Shelling

## 2020-10-06 NOTE — Progress Notes (Signed)
Post Partum Day 1 Subjective: Eating, drinking, voiding, ambulating well.  +flatus.  Lochia and pain wnl.  Denies dizziness, lightheadedness, or sob. No complaints.   Objective: Blood pressure 97/64, pulse 70, temperature 98.2 F (36.8 C), temperature source Oral, resp. rate 18, height 5\' 2"  (1.575 m), weight 61.8 kg, last menstrual period 11/24/2019, SpO2 100 %.  Physical Exam:  General: alert, cooperative and no distress Lochia: appropriate Uterine Fundus: firm Incision: n/a DVT Evaluation: No evidence of DVT seen on physical exam. Negative Homan's sign. No cords or calf tenderness. No significant calf/ankle edema.  Recent Labs    10/05/20 0830  HGB 15.0  HCT 43.5    Assessment/Plan: Plan for discharge tomorrow, Breastfeeding and Contraception OP Nexplanon   LOS: 1 day   10/07/20 10/06/2020, 6:30 AM

## 2020-10-07 ENCOUNTER — Inpatient Hospital Stay (HOSPITAL_COMMUNITY): Payer: Self-pay

## 2020-10-07 ENCOUNTER — Ambulatory Visit: Payer: Self-pay

## 2020-10-07 ENCOUNTER — Inpatient Hospital Stay (HOSPITAL_COMMUNITY): Admission: AD | Admit: 2020-10-07 | Payer: Self-pay | Source: Home / Self Care | Admitting: Family Medicine

## 2020-10-07 ENCOUNTER — Other Ambulatory Visit (HOSPITAL_COMMUNITY): Payer: Self-pay | Admitting: Family Medicine

## 2020-10-07 DIAGNOSIS — O9853 Other viral diseases complicating the puerperium: Secondary | ICD-10-CM

## 2020-10-07 DIAGNOSIS — Z8759 Personal history of other complications of pregnancy, childbirth and the puerperium: Secondary | ICD-10-CM

## 2020-10-07 DIAGNOSIS — U071 COVID-19: Secondary | ICD-10-CM

## 2020-10-07 MED ORDER — IBUPROFEN 600 MG PO TABS: 600.0000 mg | ORAL_TABLET | Freq: Four times a day (QID) | ORAL | 0 refills | Status: DC

## 2020-10-07 MED ORDER — ACETAMINOPHEN 325 MG PO TABS: 650.0000 mg | ORAL_TABLET | ORAL | 0 refills | Status: DC | PRN

## 2020-10-07 MED FILL — ACETAMINOPHEN 325 MG TABS: 325 | 5 days supply | Qty: 30 | Fill #0

## 2020-10-07 MED FILL — IBUPROFEN 600 MG TABLET: 600 | 7 days supply | Qty: 30 | Fill #0

## 2020-10-07 NOTE — Lactation Note (Signed)
This note was copied from a baby's chart. Lactation Consultation Note Baby 32 hrs old. Mom stated the baby hasn't voided. Baby has had a lot of stool but no void per mom. Asked mom if she has seen blue stripe on diaper mom stated no. Mentioned to mom baby can't go home until a void is noted. Make sure she shows all the diapers to the nurse. Save them for RN to look at.  Engorgement management, milk storage, assessing for transfer of milk, cont. STS, importance of I&O. Discussed support groups. Mom has an aunt that will be staying with her to help her.  Mom has Lactation brochure if needs f/u appt.  Patient Name: Melanie Maldonado [id number] Date: 10/07/2020 Reason for consult: Follow-up assessment;Primapara Age:56 hours  Maternal Data    Feeding Feeding Type: Breast Fed  LATCH Score Latch: Grasps breast easily, tongue down, lips flanged, rhythmical sucking.  Audible Swallowing: A few with stimulation  Type of Nipple: Everted at rest and after stimulation  Comfort (Breast/Nipple): Filling, red/small blisters or bruises, mild/mod discomfort (sore)  Hold (Positioning): Assistance needed to correctly position infant at breast and maintain latch.  LATCH Score: 7  Interventions Interventions: Adjust position;Support pillows;Position options;Breast massage;Breast compression  Lactation Tools Discussed/Used     Consult Status Consult Status: Complete Date: 10/07/20    Charyl Dancer 10/07/2020, 6:41 AM

## 2020-10-07 NOTE — Discharge Instructions (Signed)
Postpartum Care After Vaginal Delivery The following information offers guidance about how to care for yourself from the time you deliver your baby to 6-12 weeks after delivery (postpartum period). If you have problems or questions, contact your health care provider for more specific instructions. Follow these instructions at home: Vaginal bleeding  It is normal to have vaginal bleeding (lochia) after delivery. Wear a sanitary pad for bleeding and discharge. ? During the first week after delivery, the amount and appearance of lochia is often similar to a menstrual period. ? Over the next few weeks, it will gradually decrease to a dry, yellow-brown discharge. ? For most women, lochia stops completely by 4-6 weeks after delivery, but can vary.  Change your sanitary pads frequently. Watch for any changes in your flow, such as: ? A sudden increase in volume. ? A change in color. ? Large blood clots.  If you pass a blood clot from your vagina, save it and call your health care provider. Do not flush blood clots down the toilet before talking with your health care provider.  Do not use tampons or douches until your health care provider approves.  If you are not breastfeeding, your period should return 6-8 weeks after delivery. If you are feeding your baby breast milk only, your period may not return until you stop breastfeeding. Perineal care  Keep the area between the vagina and the anus (perineum) clean and dry. Use medicated pads and pain-relieving sprays and creams as directed.  If you had a surgical cut in the perineum (episiotomy) or a tear, check the area for signs of infection until you are healed. Check for: ? More redness, swelling, or pain. ? Fluid or blood coming from the cut or tear. ? Warmth. ? Pus or a bad smell.  You may be given a squirt bottle to use instead of wiping to clean the perineum area after you use the bathroom. Pat the area gently to dry it.  To relieve pain  caused by an episiotomy, a tear, or swollen veins in the anus (hemorrhoids), take a warm sitz bath 2-3 times a day. In a sitz bath, the warm water should only come up to your hips and cover your buttocks.   Breast care  In the first few days after delivery, your breasts may feel heavy, full, and uncomfortable (breast engorgement). Milk may also leak from your breasts. Ask your health care provider about ways to help relieve the discomfort.  If you are breastfeeding: ? Wear a bra that supports your breasts and fits well. Use breast pads to absorb milk that leaks. ? Keep your nipples clean and dry. Apply creams and ointments as told. ? You may have uterine contractions every time you breastfeed for up to several weeks after delivery. This helps your uterus return to its normal size. ? If you have any problems with breastfeeding, notify your health care provider or lactation consultant.  If you are not breastfeeding: ? Avoid touching your breasts. Do not squeeze out (express) milk. Doing this can make your breasts produce more milk. ? Wear a good-fitting bra and use cold packs to help with swelling. Intimacy and sexuality  Ask your health care provider when you can engage in sexual activity. This may depend upon: ? Your risk of infection. ? How fast you are healing. ? Your comfort and desire to engage in sexual activity.  You are able to get pregnant after delivery, even if you have not had your period. Talk with   your health care provider about methods of birth control (contraception) or family planning if you desire future pregnancies. Medicines  Take over-the-counter and prescription medicines only as told by your health care provider.  Take an over-the-counter stool softener to help ease bowel movements as told by your health care provider.  If you were prescribed an antibiotic medicine, take it as told by your health care provider. Do not stop taking the antibiotic even if you start to  feel better.  Review all previous and current prescriptions to check for possible transfer into breast milk. Activity  Gradually return to your normal activities as told by your health care provider.  Rest as much as possible. Nap while your baby is sleeping. Eating and drinking  Drink enough fluid to keep your urine pale yellow.  To help prevent or relieve constipation, eat high-fiber foods every day.  Choose healthy eating to support breastfeeding or weight loss goals.  Take your prenatal vitamins until your health care provider tells you to stop.   General tips/recommendations  Do not use any products that contain nicotine or tobacco. These products include cigarettes, chewing tobacco, and vaping devices, such as e-cigarettes. If you need help quitting, ask your health care provider.  Do not drink alcohol, especially if you are breastfeeding.  Do not take medications or drugs that are not prescribed to you, especially if you are breastfeeding.  Visit your health care provider for a postpartum checkup within the first 3-6 weeks after delivery.  Complete a comprehensive postpartum visit no later than 12 weeks after delivery.  Keep all follow-up visits for you and your baby. Contact a health care provider if:  You feel unusually sad or worried.  Your breasts become red, painful, or hard.  You have a fever or other signs of an infection.  You have bleeding that is soaking through one pad an hour or you have blood clots.  You have a severe headache that doesn't go away or you have vision changes.  You have nausea and vomiting and are unable to eat or drink anything for 24 hours. Get help right away if:  You have chest pain or difficulty breathing.  You have sudden, severe leg pain.  You faint or have a seizure.  You have thoughts about hurting yourself or your baby. If you ever feel like you may hurt yourself or others, or have thoughts about taking your own life,  get help right away. Go to your nearest emergency department or:  Call your local emergency services (911 in the [place]  The National Suicide Prevention Lifeline at (310) 286-2421. This suicide crisis helpline is open 24 hours a day.  Text the Crisis Text Line at 301-664-1037 (in the [place] Summary  The period of time after you deliver your newborn up to 6-12 weeks after delivery is called the postpartum period.  Keep all follow-up visits for you and your baby.  Review all previous and current prescriptions to check for possible transfer into breast milk.  Contact a health care provider if you feel unusually sad or worried during the postpartum period. This information is not intended to replace advice given to you by your health care provider. Make sure you discuss any questions you have with your health care provider. Document Revised: 05/13/2020 Document Reviewed: 05/13/2020 Elsevier Patient Education  2021 Elsevier Inc.   Ch?m [place] h?u s?n sau khi sinh th??ng Postpartum Care After Vaginal Delivery Thng tin sau ?y cung c?p h??ng d?n v? cch ch?m   b?n thn qu v? t? lc sinh con cho ??n 6-12 tu?n sau sinh (th?i k? h?u s?n). N?u qu v? c cc v?n ?? ho?c c th?c m?c, hy lin h? v?i chuyn gia ch?m [place] s?c kh?e ?? ???c h??ng d?n c? th? h?n. Tun th? nh?ng h??ng d?n ny ? nh: Ch?y mu m ??o  Ch?y mu m ??o (s?n d?ch) sau khi sinh l bnh th??ng. Hy mang b?ng v? sinh ?? th?m mu v d?ch ti?t. ? Trong tu?n ??u tin sau khi sinh, l??ng v mu s?c c?a s?n d?ch th??ng t??ng t? v?i kinh nguy?t. ? Trong vi tu?n ti?p theo, n s? gi?m d?n thnh d?ch ti?t kh, mu vng nu. ? ??i v?i h?u h?t ph? n?, s?n d?ch ng?ng hon ton sau sinh 4-6 tu?n, nh?ng c th? thay ??i.  Thay b?ng v? sinh th??ng xuyn. Theo di b?t c? thay ??i no v? vi?c ra mu, ch?ng h?n nh?: ? ??t ng?t t?ng l??ng mu ch?y ra. ? Thay ??i mu s?c. ? C?c mu ?ng l?n.  N?u quy? vi? ra m?t cu?c ma?u ?ng ? m ??o, ha?y  gi?? n la?i v g?i cho chuyn gia ch?m so?c s??c kho?e. Khng xa? tri ca?c cu?c ma?u ?ng xu?ng b?n c?u ma? ch?a ni v?i chuyn gia ch?m so?c s??c kho?e.  Khng s?? du?ng nu?t g?c ho??c thu?t r??a cho ??n khi ???c chuyn gia ch?m so?c s??c kho?e ch?p thu?n.  N?u qu v? khng nui con b?ng s?a m?, qu v? s? c kinh nguy?t tr? l?i trong vng 6-8 tu?n sau khi sinh con. N?u qu v? ch? cho con b s?a m?, kinh nguy?t c th? khng c tr? l?i cho ??n khi qu v? ng?ng cho con b. Ch?m [place] vng ?y ch?u  Gi? cho vng gi?a m ??o v h?u mn (?y ch?u) s?ch v kh. S? d?ng b?ng t?m thu?c v thu?c x?t v kem c thu?c gi?m ?au theo ch? d?n.  N?u qu v? c v?t c?t ph?u thu?t ? ?y ch?u (c?t t?ng sinh mn) ho?c v?t rch, hy ki?m tra khu v?c ? xem c d?u hi?u nhi?m trng khng cho ??n khi qu v? lnh l?i. Ki?m tra xem c: ? ??, s?ng, ho?c ?au nhi?u h?n. ? D?ch ho?c mu ch?y ? v?t c?t ho?c v?t rch. ? ?m. ? M? ho?c mi hi.  Qu v? c th? ???c cung c?p m?t bnh phun ?? s? d?ng thay v lau ?? lm s?ch vng ?y ch?u sau khi s? d?ng nh v? sinh. Th?m nh? nhng ?? lm kh vng ny.  ?? gi?m ?au do th? thu?t c?t t?ng sinh mn, v?t rch, ho?c s?ng t?nh m?ch trong h?u mn (b?nh tr?), hy ngm mng trong n??c ?m 2-3 l?n m?i ngy. Trong khi ngm mng, n??c ?m ch? c?n ng?p ??n hng v ng?p mng qu v?.   Ch?m [place] v  Trong vi ngy ??u tin sau sinh, v c?a qu v? c th? c?m gic n?ng, c?ng v c?m gic kh ch?u (c??ng s?a). S?a ? v c?ng c th? r? ra. Hy h?i chuyn gia ch?m  s?c kh?e v? cch gip gi?m c?m gic kh ch?u.  N?u quy? vi? ?ang cho con b: ? M??c a?o ng??c h? tr?? cho vu? va? v??a v??n v??i quy? vi?. S? d?ng mi?ng ??m o ng?c ?? th?m s?a r? ra. ? Gi? cho nm v c?a qu v? s?ch v kh. Bi kem v thu?c m? theo ch? d?n. ? Qu v? c  th? c cc c?n co t? cung m?i l?n cho con b cho ??n vi tu?n sau sinh. ?i?u ny gip t? cung c?a qu v? tr? l?i kch th??c bnh th??ng. ? N?u qu v? c b?t k? v?n ??  no v?i vi?c cho con b, hy thng bo cho chuyn gia ch?m [place] s?c kh?e ho?c chuyn gia t? v?n nui con b?ng s?a m?.  N?u qu v? khng cho con b: ? Trnh ch?m vo v. Khng v?t (n?n) s?a. Lm nh? v?y c th? khi?n v s?n xu?t nhi?u s?a h?n. ? M?c o ng?c v?a v?n v s? d?ng ti ch??m l?nh ?? gip gi?m s?ng. Quan h? thn m?t v tnh d?c  H?i chuyn gia ch?m so?c s??c kho?e cu?a quy? vi? v? vi?c khi na?o quy? vi? co? th? quan h? ti?nh du?c. ?i?u na?y c th? phu? thu?c va?o: ? Nguy c? nhi?m tru?ng c?a qu v?. ? T?c ?? lnh l?i c?a qu v?. ? C?m gic thoa?i ma?i va? ham mu?n quan h? ti?nh du?c c?a qu v?.  Qu v? c th? c [place] sau khi sinh con, ngay c? khi qu v? ch?a th?y kinh nguy?t. Hy trao ??i v?i chuyn gia ch?m [place] s?c kh?e v? cc ph??ng php ng?a thai (trnh [place]) ho?c k? ho?ch ha gia ?nh n?u qu v? mu?n mang thai sau ny. Thu?c  Ch? s? d?ng thu?c khng k ??n v thu?c k ??n theo ch? d?n c?a chuyn gia ch?m [place] s?c kh?e.  Ch? s? d?ng thu?c lm m?m phn khng k ??n ?? gip d? dng ?i ??i ti?n theo ch? d?n c?a chuyn gia ch?m [place] s?c kh?e.  N?u qu v? ???c k ??n thu?c khng sinh, hy dng thu?c theo ch? d?n c?a chuyn gia ch?m [place] s?c kh?e. Khng d?ng s? d?ng thu?c khng sinh ngay c? khi qu v? b?t ??u c?m th?y ?? h?n.  Xem l?i t?t c? cc ??n thu?c tr??c ?y v hi?n t?i ?? ki?m tra kh? n?ng thu?c truy?n vo s?a m?. Ho?t ??ng  D?n tr? l?i sinh ho?t bnh th??ng theo ch? d?n c?a chuyn gia ch?m [place] s?c kh?e.  Ngh? ng?i cng nhi?u cng t?t. Ch?p m?t khi con qu v? ng?. ?n v u?ng  U?ng ?? n??c ?? gi? cho n??c ti?u c mu vng nh?t.  ?? gip ng?n ng?a ho?c gi?m to bn, hy ?n th?c ph?m ch?a nhi?u ch?t x? m?i ngy.  Ch?n ch? ?? ?n lnh m?nh ?? h? tr? cho con b ho?c m?c tiu gi?m cn.  U?ng vitamin tr??c khi sinh cho ??n khi chuyn gia ch?m [place] s?c kh?e ni qu v? d?ng l?i.   L?i khuyn/khuy?n ngh? chung  Khng s? d?ng b?t k? s?n ph?m no c nicotine ho?c thu?c l.  Nh?ng s?n ph?m ny bao g?m thu?c l d?ng ht, thu?c l d?ng nhai v d?ng c? ht thu?c, ch?ng h?n nh? thu?c l ?i?n t?. N?u qu v? c?n gip ?? ?? cai thu?c, hy h?i chuyn gia ch?m [place] s?c kh?e.  Khng u?ng bia r??u, ??c bi?t l n?u qu v? ?ang cho con b.  Khng dng thu?c ho?c thu?c khng ???c k ??n cho qu v?, ??c bi?t l n?u qu v? ?ang cho con b.  G?p chuyn gia ch?m  s?c kh?e ?? khm s?c kh?e h?u s?n trong vng 3-6 tu?n ??u sau khi sinh.  Hon thnh l?n khm sau sinh ton di?n khng mu?n h?n 12 tu?n sau khi sinh.  Tun th? t?t  c? cc l?n khm theo di cho qu v? v con qu v?. Hy lin l?c v?i chuyn gia ch?m [place] s?c kh?e n?u:  Quy? vi? ca?m th?y bu?n ho??c lo l??ng b?t th???ng.  Vu? cu?a quy? vi? b? ??, ?au, ho?c c?ng.  Qu v? b? s?t ho?c cc d?u hi?u khc c?a nhi?m trng.  Qu v? b? ch?y mu lm ??t m?t b?ng v? sinh m?i gi? ho?c qu v? c c?c mu ?ng.  Qu v? b? ?au ??u r?t nhi?u m khng kh?i ho?c qu v? b? thay ??i th? l?c.  Qu v? bu?n nn v nn i v khng th? ?n ho?c u?ng b?t c? th? g trong 24 gi?[place] c?u tr? gip ngay l?p t?c n?u:  Qu v? b? ?au ng?c ho?c kh th?.  Qu v? b? ?au chn r?t nhi?u, ??t ng?t.  Qu v? ng?t ho?c co gi?t.  Qu v? c  nghi? v? vi?c la?m t?n th??ng ba?n thn ho?c con quy? vi?. N?u qu v? ? t?ng c?m th?y nh? c th? t? lm th??ng t?n b?n thn ho?c ng??i khc, ho?c c suy ngh? v? vi?c t? t?, hy yu c?u tr? gip ngay l?p t?c. ??n phng c?p c?u g?n nh?t ho?c:  G?i cho d?ch v? c?p c?u t?i ??a ph??ng (911 ? [place]  National Suicide Prevention Lifeline (???ng Dy Ng?n Ch?n T? T? Qu?c Gia) theo s? [phone]. ???ng dy tr? gip kh?ng ho?ng t? t? ny ho?t ??ng 24 gi? m?t ngy.  G?i tin nh?n t?i Crisis Text Line (???ng dy kh?ng ho?ng) theo s? [phone] (t?i [place] K?). Tm t?t  Kho?ng th?i gian sau khi qu v? sinh em b cho ??n 6-12 tu?n sau khi sinh ???c g?i l th?i k? h?u s?n.  Tun th? t?t c? cc l?n khm theo di cho qu v?  v con qu v?.  Xem l?i t?t c? cc ??n thu?c tr??c ?y v hi?n t?i ?? ki?m tra kh? n?ng thu?c truy?n vo s?a m?.  Lin h? v?i chuyn gia ch?m [place] s?c kh?e n?u qu v? c?m th?y bu?n ho?c lo l?ng b?t th??ng trong th?i k? h?u s?n. Thng tin ny khng nh?m m?c ?ch thay th? cho l?i khuyn m chuyn gia ch?m [place] s?c kh?e ni v?i qu v?. Hy b?o ??m qu v? ph?i th?o lu?n b?t k? v?n ?? g m qu v? c v?i chuyn gia ch?m [place] s?c kh?e c?a qu v?. Document Revised: 06/18/2020 Document Reviewed: 06/09/2020 Elsevier Patient Education  2021 [employer].

## 2020-10-07 NOTE — Lactation Note (Signed)
This note was copied from a baby's chart. Lactation Consultation Note  Patient Name: Melanie Maldonado KPVVZ'S Date: 10/07/2020 Reason for consult: Follow-up assessment;Primapara;1st time breastfeeding;Term;Infant weight loss;Other (Comment) Age:27 hours  LC was called by the Barnetta Chapel, NP concerned with how the breast feedings  Are going,due to baby has not voided. Per mom felt the baby had a wet yesterday. Baby has had 8 stools and per mom larger than a quarter.  Baby awake and as LC entered mom about to feed the baby a bottle.  LC offered to assist with latch,mom receptive.  LC reviewed basics and worked on latching, positioning, depth.  Mom expressed to [facility] the latch felt so much better and the feedings were comfortable both breast.  Right nipple has some soreness, but the latch was comfortable . Both latches the nipples were well rounded after the baby released.  LC provided findings from the [facility] consult to Barnetta Chapel, NP and Ephriam Jenkins, [facility] .   LC updated the doc flow sheets for the 2 feedings. See doc flow sheets for details.     Maternal Data Has patient been taught Hand Expression?: Yes  Feeding Feeding Type: Breast Fed Nipple Type: Extra Slow Flow  LATCH Score Latch: Grasps breast easily, tongue down, lips flanged, rhythmical sucking.  Audible Swallowing: Spontaneous and intermittent  Type of Nipple: Everted at rest and after stimulation  Comfort (Breast/Nipple): Filling, red/small blisters or bruises, mild/mod discomfort  Hold (Positioning): Assistance needed to correctly position infant at breast and maintain latch.  LATCH Score: 8  Interventions Interventions: Breast feeding basics reviewed;Assisted with latch;Skin to skin;Breast massage;Hand express;Breast compression;Adjust position;Support pillows;Position options  Lactation Tools Discussed/Used     Consult Status Consult Status: Follow-up Date: 10/08/20 Follow-up type:  In-patient    Melanie Maldonado 10/07/2020, 6:57 PM

## 2021-06-10 IMAGING — US US OB TRANSVAGINAL
1 series · 15 of 28 positions shown · non-contrast
Comparison: 02/02/2020

CLINICAL DATA: First trimester pregnancy, assess fetal viability,
vaginal bleeding and abdominal pain, threatened miscarriage, LMP
11/24/2019

EXAM:
TRANSVAGINAL OB ULTRASOUND
TECHNIQUE: Transvaginal ultrasound was performed for complete evaluation of the
gestation as well as the maternal uterus, adnexal regions, and
pelvic cul-de-sac.

[Series 1: us ob transvaginal · 15 of 41 slices shown]
[im 1/41]
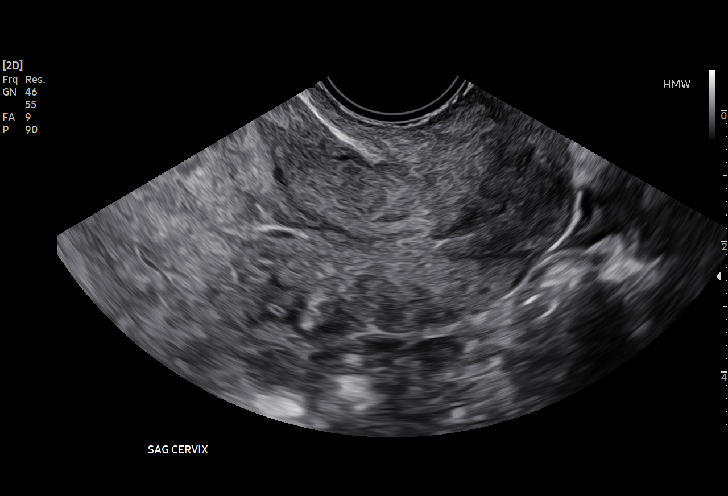
[im 3/41]
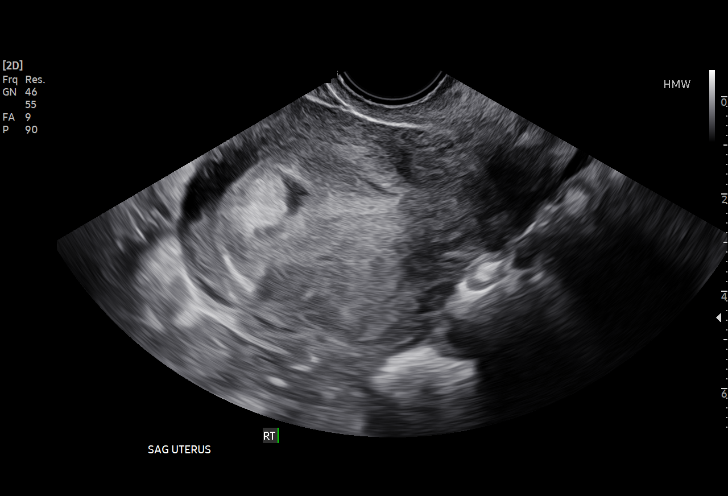
[im 6/41]
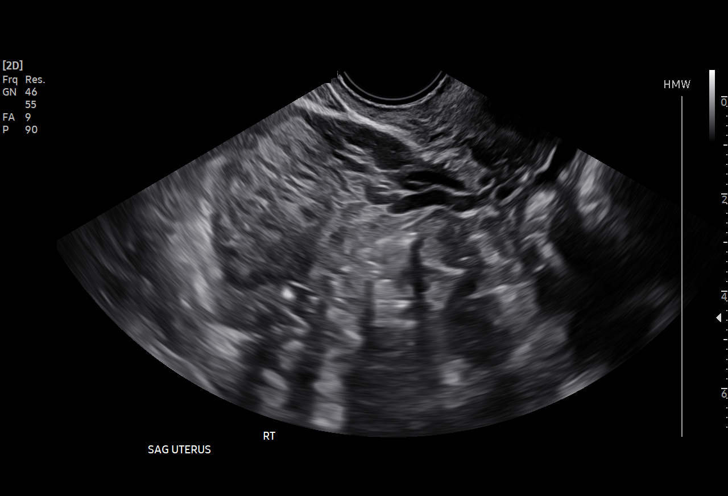
[im 9/41]
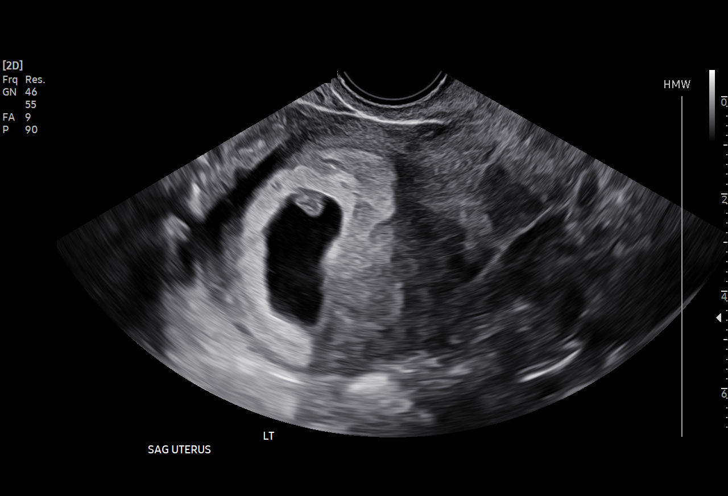
[im 12/41]
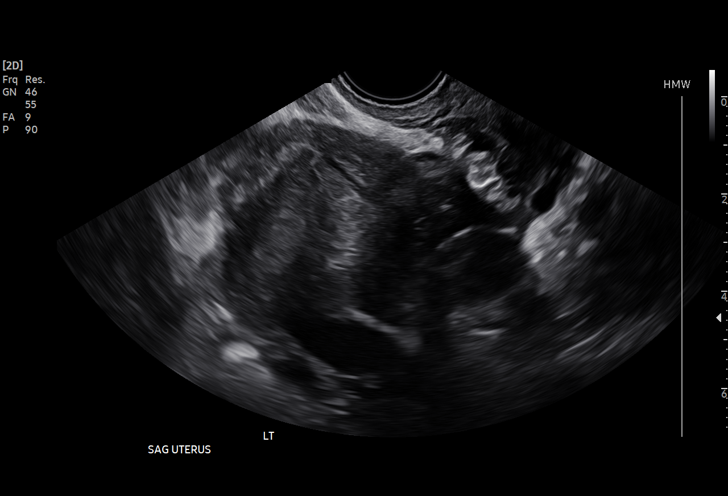
[im 15/41]
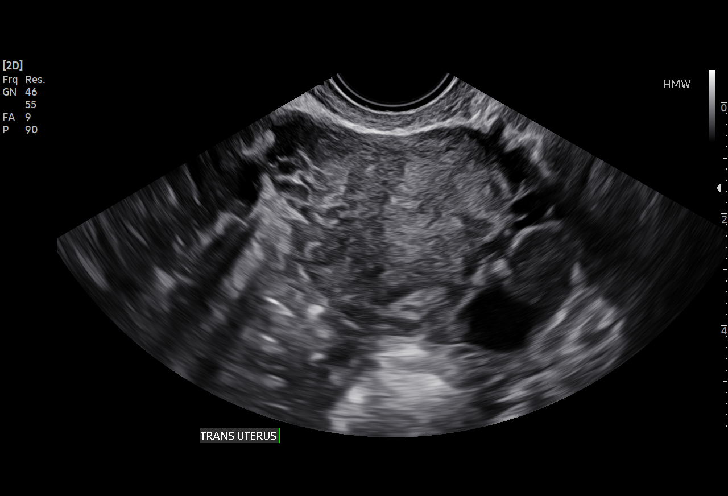
[im 18/41]
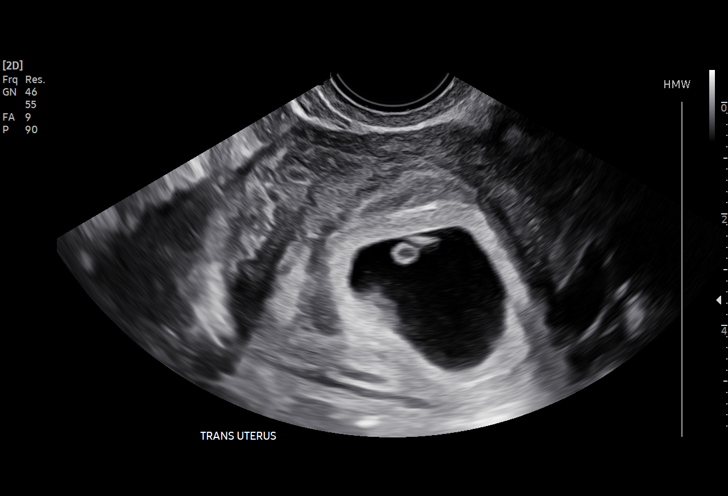
[im 21/41]
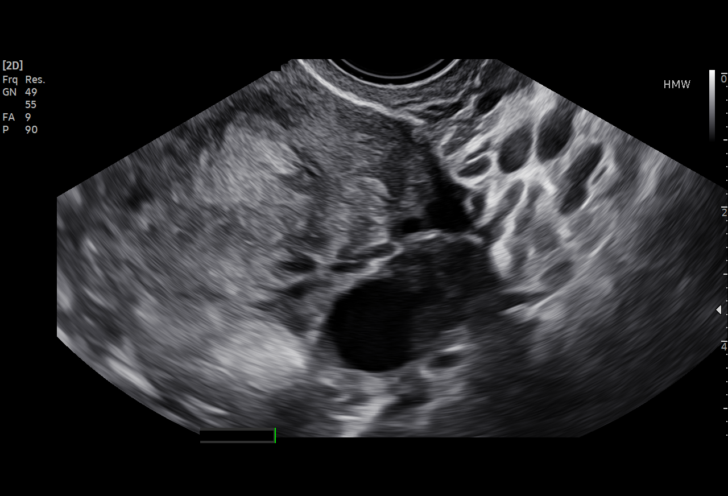
[im 23/41]
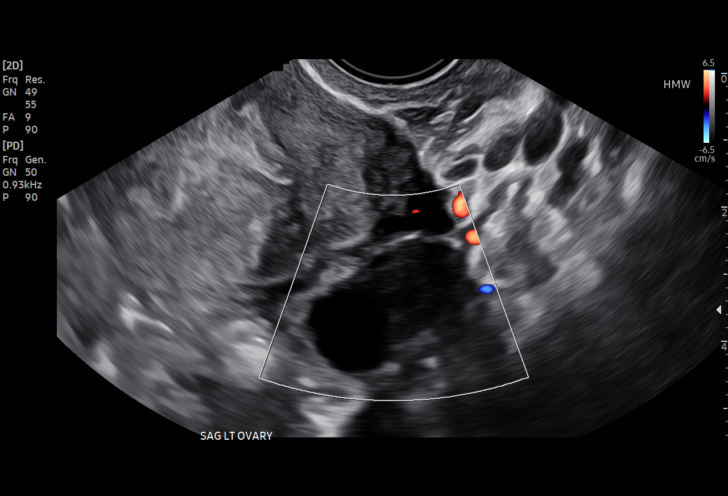
[im 26/41]
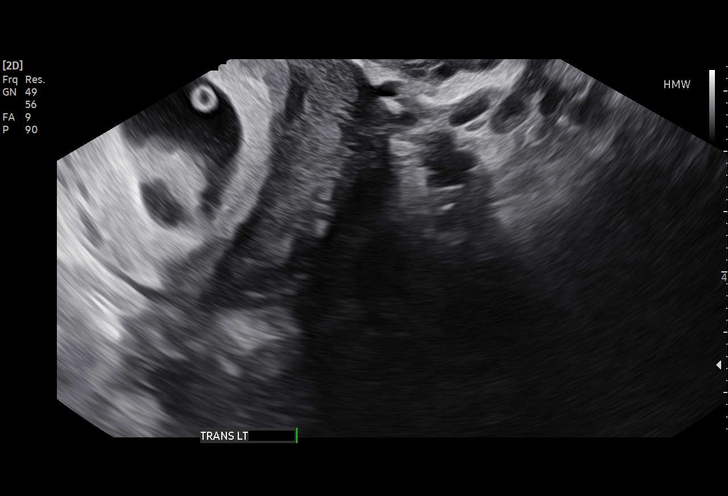
[im 29/41]
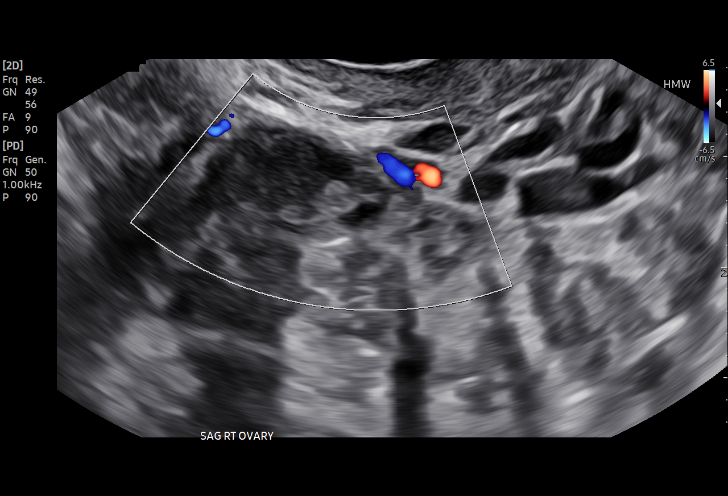
[im 32/41]
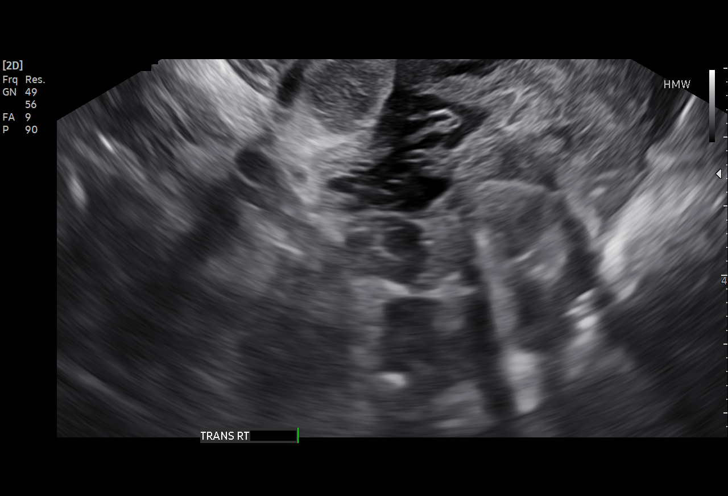
[im 35/41]
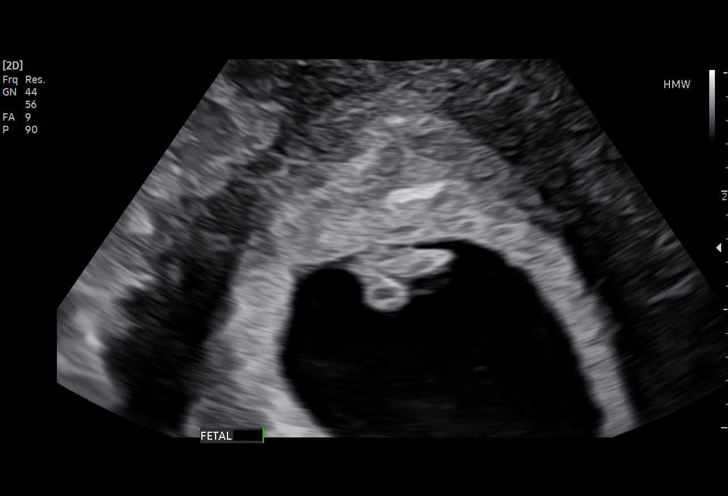
[im 38/41]
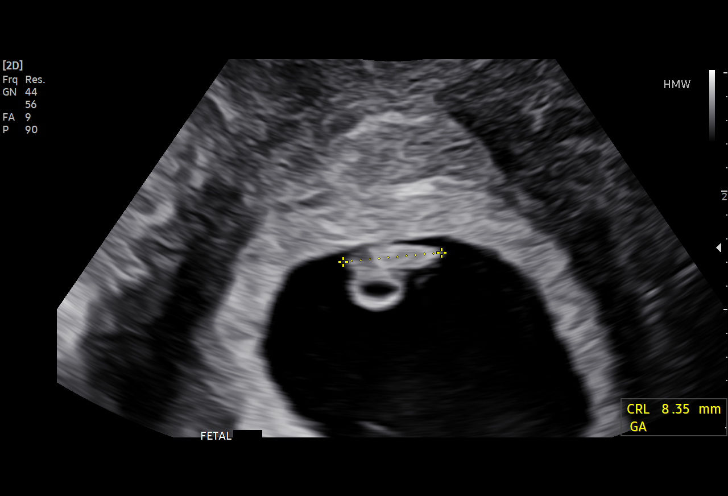
[im 41/41]
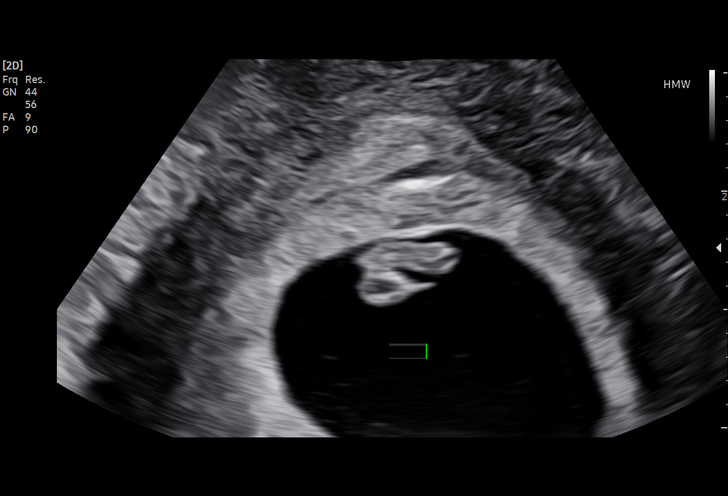

[15 of 28 positions shown; findings below may reference images not displayed]

FINDINGS: Intrauterine gestational sac: Present, single

Yolk sac:  Present

Embryo:  Present

Cardiac Activity: Present

Heart Rate: 127 bpm

CRL:   8.4 mm   6 w 5 d                  US EDC: 09/30/2020

Subchorionic hemorrhage:  None visualized.

Maternal uterus/adnexae:

Maternal uterus anteverted, otherwise unremarkable.

LEFT ovary measures 2.8 x 1.5 x 2.2 cm and contains a corpus luteal
cyst.

RIGHT ovary normal size and morphology, 1.2 x 0.9 x 1.2 cm.

No free pelvic fluid or adnexal masses.
IMPRESSION: Single live intrauterine gestation at 6 weeks 5 days EGA by
crown-rump length.

No acute abnormalities.

## 2021-07-24 IMAGING — US US MFM FETAL NUCHAL TRANSLUCENCY
1 series · 14 of 28 positions shown · non-contrast
Comparison: none

[Series 1: us mfm fetal nuchal translucency · 14 of 52 slices shown]
[im 2/52]
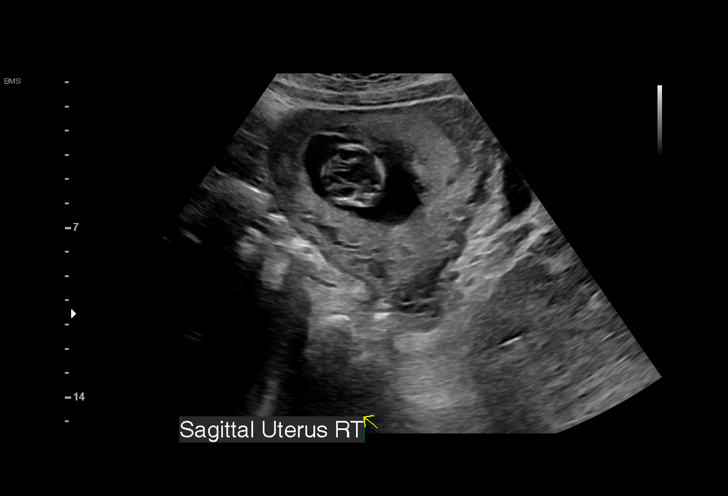
[im 6/52]
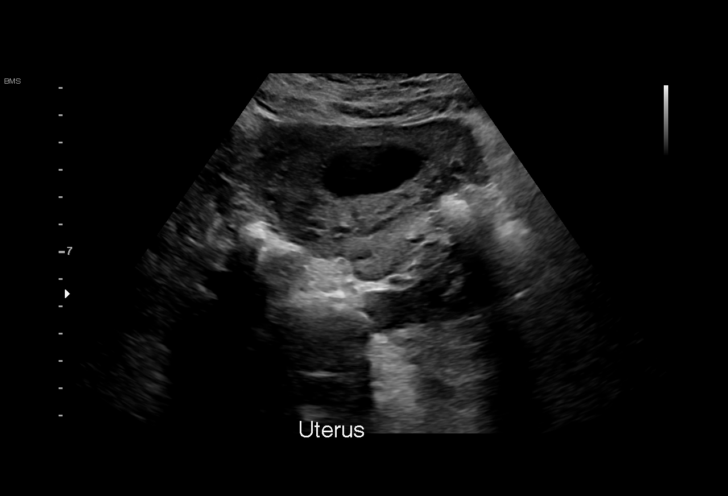
[im 10/52]
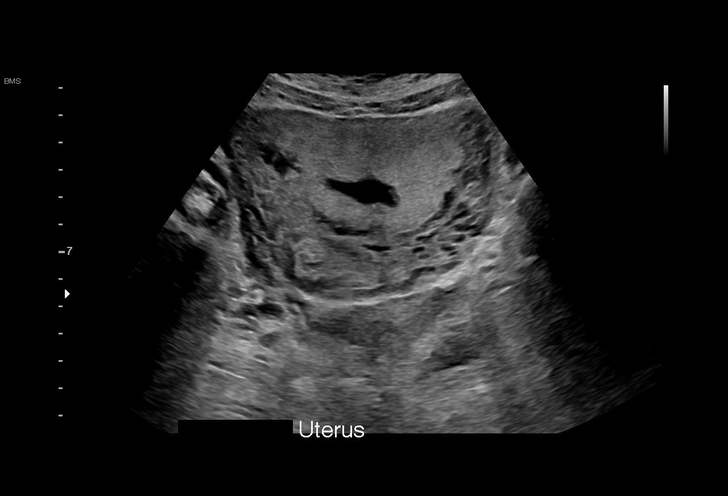
[im 14/52]
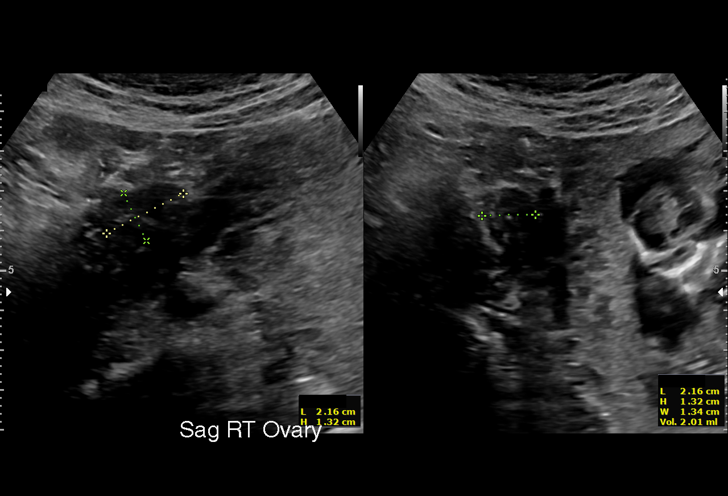
[im 18/52]
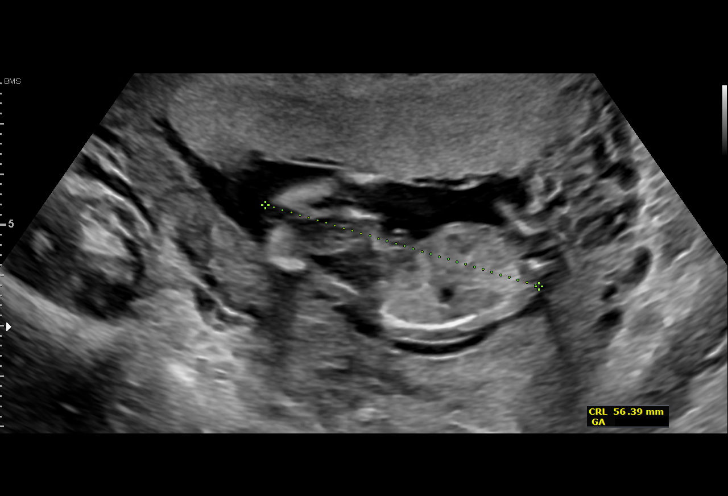
[im 21/52]
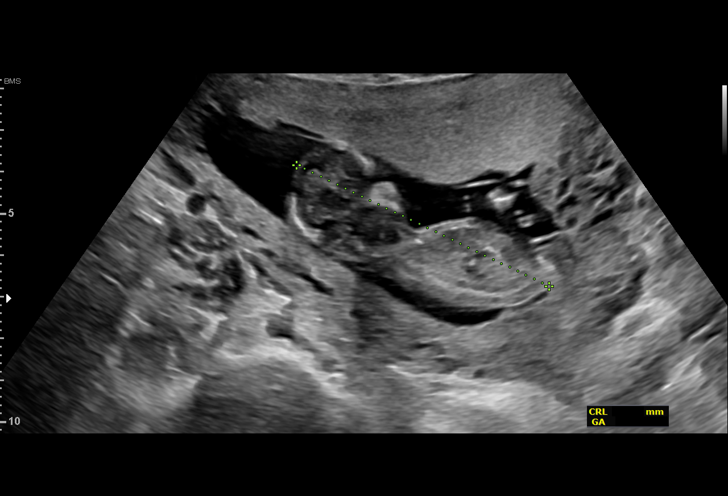
[im 25/52]
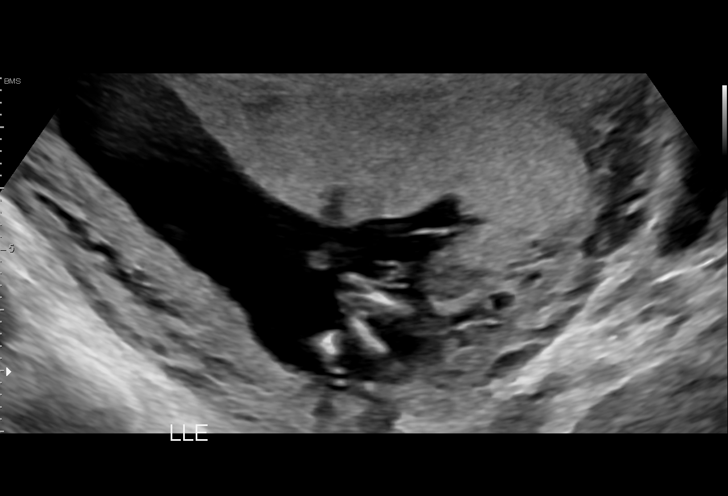
[im 29/52]
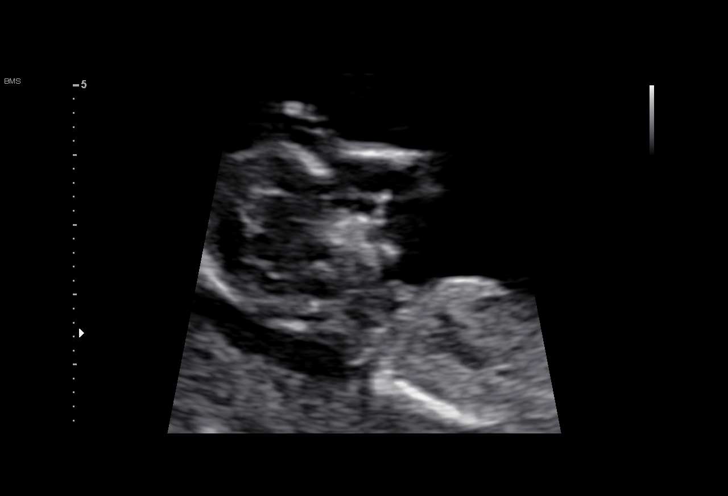
[im 33/52]
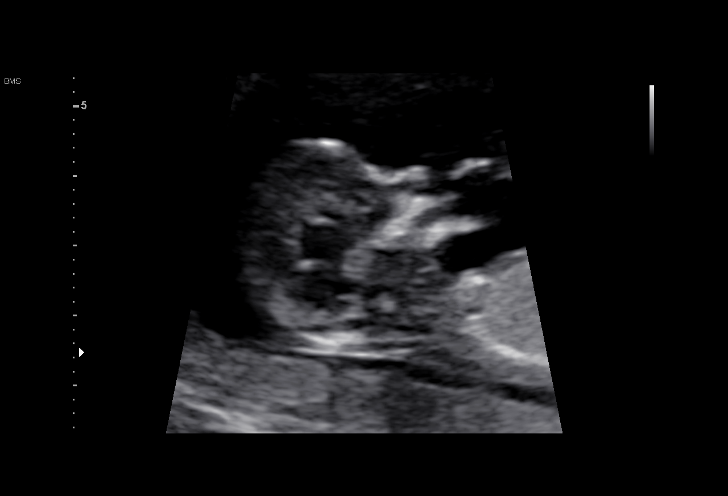
[im 36/52]
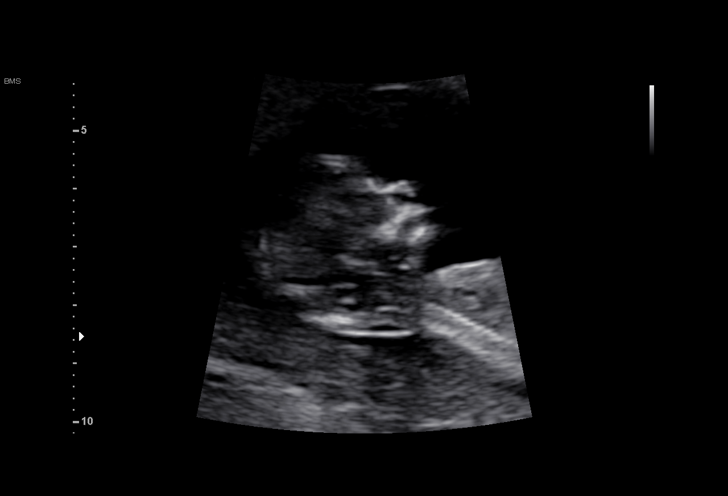
[im 40/52]
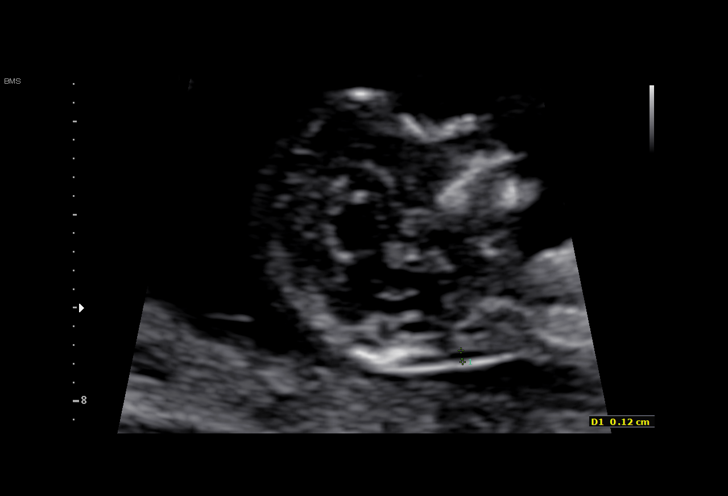
[im 44/52]
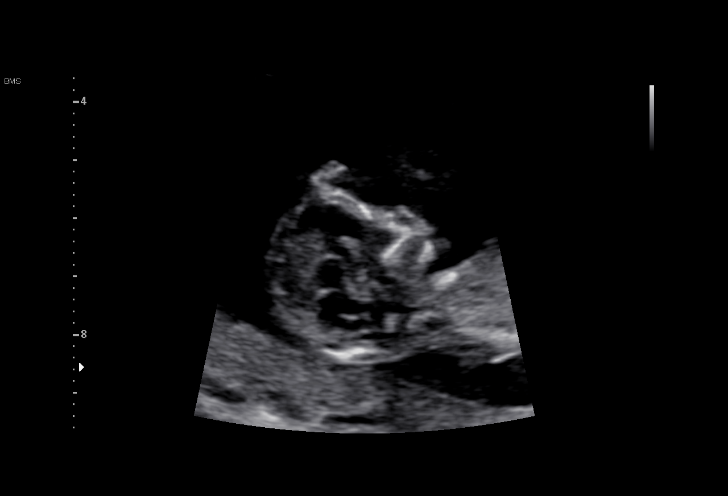
[im 48/52]
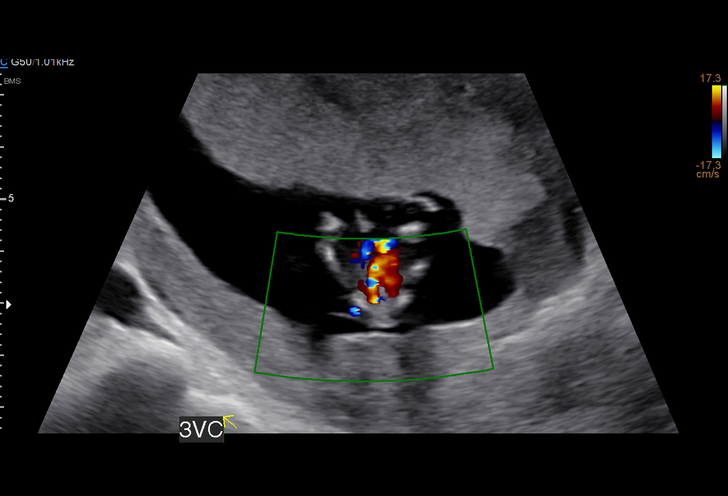
[im 52/52]
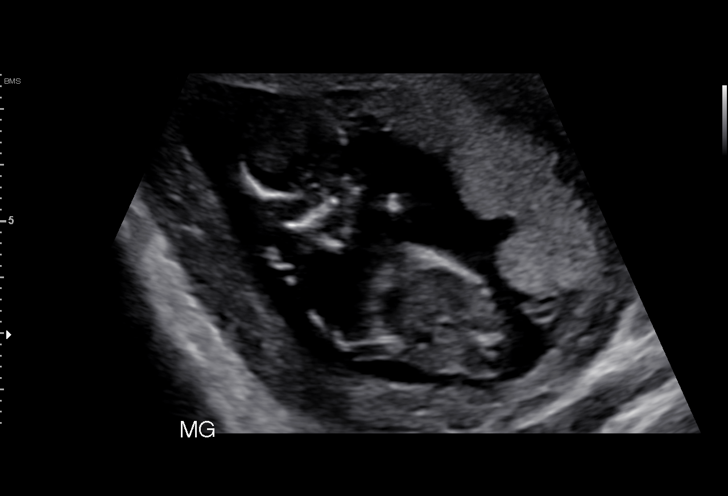

[14 of 28 positions shown; findings below may reference images not displayed]

TRANSLUCENCY

Indications

 13 weeks gestation of pregnancy
 Encounter for nuchal translucency
Fetal Evaluation

 Num Of Fetuses:         1
 Fetal Heart Rate(bpm):  165
 Cardiac Activity:       Observed
 Presentation:           [place]
OB History

 Blood Type:   O+
 Gravidity:    1
 Living:       0
Gestational Age

 LMP:           19w 3d        Date:  11/10/19                 EDD:   08/16/20
 Clinical EDD:  13w 0d                                        EDD:   09/30/20
 Best:          13w 0d     Det. By:  Clinical EDD             EDD:   09/30/20
1st Trimester Genetic Sonogram Screening

 CRL:            72.1  mm    G. Age:   13w 1d                 EDD:   09/29/20
 Nuc Trans:       1.2  mm
 Nasal Bone:                 Present
Anatomy

 Cranium:               Appears normal         Abdominal Wall:         Appears nml (cord
                                                                       insert, abd wall)
 Choroid Plexus:        Visualized             Bladder:                Visualized
 Heart:                 Appears normal         Upper Extremities:      Visualized
                        (4CH, axis, and
                        situs)
 Stomach:               Appears normal, left   Lower Extremities:      Visualized
                        sided
Cervix Uterus Adnexa

 Cervix
 Closed

 Uterus
 No abnormality visualized.

 Right Ovary
 Within normal limits.

 Left Ovary
 Not visualized.

 Cul De Sac
 No free fluid seen.

 Adnexa
 No adnexal mass visualized.
Impression

 On ultrasound, the CRL measurement is consistent with her
 previously-established dates and good fetal heart activity is
 seen. The nuchal translucency (NT) measures
 millimeters, which is normal.  Fetal anatomy that could be
 ascertained at this gestational age is normal.

 Patient was sent over to the lab to have her blood drawn for
 first-trimester screening.

 We will communicate the results to the patient and fax a copy
 to your office.
Recommendations

 Fetal anatomy scan at 18-20 weeks.
                 Mcgrath, Antwan

## 2022-01-18 ENCOUNTER — Ambulatory Visit (INDEPENDENT_AMBULATORY_CARE_PROVIDER_SITE_OTHER): Payer: 59 | Admitting: Family Medicine

## 2022-01-18 ENCOUNTER — Encounter: Payer: Self-pay | Admitting: Family Medicine

## 2022-01-18 VITALS — BP 106/62 | HR 67 | Temp 97.8°F | Ht 62.0 in | Wt 122.4 lb

## 2022-01-18 DIAGNOSIS — R739 Hyperglycemia, unspecified: Secondary | ICD-10-CM

## 2022-01-18 DIAGNOSIS — Z7689 Persons encountering health services in other specified circumstances: Secondary | ICD-10-CM | POA: Diagnosis not present

## 2022-01-18 NOTE — Progress Notes (Signed)
? ?New Patient Office Visit ? ?Subjective   ? ?Patient ID: Melanie Maldonado, female    DOB: 06-26-1996  Age: 26 y.o. MRN: [id number] ? ?CC:  ?Chief Complaint  ?Patient presents with  ? Establish Care  ?  No concerns  ? ? ?HPI ?Melanie Maldonado presents to establish care.  ? ?States her friend checked her blood sugar and it was elevated. She would like to make sure she does not have diabetes. Her friend was practicing due to being in nursing school.  ? ?She ate 3 hours prior to arrival today.  ? ?Denies any significant PMH.  ? ?Not on birth control.  ? ?Married. She has a one year old.  ? 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  ? ?Outpatient Encounter Medications as of 01/18/2022  ?Medication Sig  ? Multiple Vitamins-Minerals (MULTIVITAMIN WOMEN PO) Take by mouth.  ? [DISCONTINUED] Prenatal Vit-Fe Fumarate-FA (PRENATAL VITAMIN PO) Take by mouth.  ? ?No facility-administered encounter medications on file as of 01/18/2022.  ? ? ?Past Medical History:  ?Diagnosis Date  ? Medical history non-contributory   ? ? ?Past Surgical History:  ?Procedure Laterality Date  ? NO PAST SURGERIES    ? ? ?History reviewed. No pertinent family history. ? ?Social History  ? ?Socioeconomic History  ? Marital status: Single  ?  Spouse name: Not on file  ? Number of children: Not on file  ? Years of education: Not on file  ? Highest education level: Not on file  ?Occupational History  ? Not on file  ?Tobacco Use  ? Smoking status: Never  ? Smokeless tobacco: Never  ?Vaping Use  ? Vaping Use: Never used  ?Substance and Sexual Activity  ? Alcohol use: Never  ? Drug use: Never  ? Sexual activity: Yes  ?  Birth control/protection: None  ?Other Topics Concern  ? Not on file  ?Social History Narrative  ? Not on file  ? ?Social Determinants of Health  ? ?Financial Resource Strain: Not on file  ?Food Insecurity: Not on file  ?Transportation Needs: Not on file  ?Physical Activity: Not on file  ?Stress: Not on file  ?Social Connections: Not on file  ?Intimate  Partner Violence: Not on file  ? ? ?ROS ?Pertinent positives and negatives in the history of present illness. ? ?  ? ? ?Objective   ? ?BP 106/62 (BP Location: Left Arm, Patient Position: Sitting, Cuff Size: Large)   Pulse 67   Temp 97.8 ?F (36.6 ?C) (Temporal)   Ht 5\' 2"  (1.575 m)   Wt 122 lb 6.4 oz (55.5 kg)   SpO2 99%   Breastfeeding No   BMI 22.39 kg/m?  ? ?Physical Exam ?Constitutional:   ?   General: She is not in acute distress. ?   Appearance: Normal appearance. She is not ill-appearing.  ?Eyes:  ?   Conjunctiva/sclera: Conjunctivae normal.  ?   Pupils: Pupils are equal, round, and reactive to light.  ?Cardiovascular:  ?   Rate and Rhythm: Normal rate and regular rhythm.  ?Pulmonary:  ?   Effort: Pulmonary effort is normal.  ?   Breath sounds: Normal breath sounds.  ?Musculoskeletal:     ?   General: Normal range of motion.  ?Skin: ?   General: Skin is warm and dry.  ?Neurological:  ?   General: No focal deficit present.  ?   Mental Status: She is alert and oriented to person, place, and time.  ?Psychiatric:     ?  Mood and Affect: Mood normal.     ?   Behavior: Behavior normal.  ? ? ? ?  ? ?Assessment & Plan:  ? ?Problem List Items Addressed This Visit   ?None ?Visit Diagnoses   ? ? Elevated blood sugar    -  Primary  ? Relevant Orders  ? Hemoglobin A1c  ? CBC with Differential/Platelet  ? Comprehensive metabolic panel  ? TSH  ? Encounter to establish care      ? ?  ? ?She is a pleasant 26 year old female who is new to the practice and here to establish care.  Reports being in her usual state of health.  Recent elevated blood sugar checked randomly since her friend is in nursing school.  She recalls it being in the 110 range.  Asymptomatic.  No family history ?Follow-up pending lab results ?Return for fasting CPE at her convenience. .  ? ?Harland Dingwall, NP-C ? ? ?

## 2022-01-18 NOTE — Patient Instructions (Signed)
Go to the lab on the first floor for blood work.  ?

## 2022-01-25 ENCOUNTER — Encounter: Payer: Self-pay | Admitting: Family Medicine

## 2022-01-25 ENCOUNTER — Ambulatory Visit (INDEPENDENT_AMBULATORY_CARE_PROVIDER_SITE_OTHER): Payer: 59 | Admitting: Family Medicine

## 2022-01-25 VITALS — BP 116/62 | HR 75 | Temp 97.8°F | Ht 62.0 in | Wt 122.0 lb

## 2022-01-25 DIAGNOSIS — L299 Pruritus, unspecified: Secondary | ICD-10-CM | POA: Diagnosis not present

## 2022-01-25 DIAGNOSIS — Z0000 Encounter for general adult medical examination without abnormal findings: Secondary | ICD-10-CM | POA: Diagnosis not present

## 2022-01-25 DIAGNOSIS — R739 Hyperglycemia, unspecified: Secondary | ICD-10-CM

## 2022-01-25 LAB — CBC WITH DIFFERENTIAL/PLATELET
Basophils Absolute: 0 10*3/uL (ref 0.0–0.1)
Basophils Relative: 0.4 % (ref 0.0–3.0)
Eosinophils Absolute: 0.1 10*3/uL (ref 0.0–0.7)
Eosinophils Relative: 1.9 % (ref 0.0–5.0)
HCT: 44.1 % (ref 36.0–46.0)
Hemoglobin: 14.8 g/dL (ref 12.0–15.0)
Lymphocytes Relative: 31.9 % (ref 12.0–46.0)
Lymphs Abs: 2.4 10*3/uL (ref 0.7–4.0)
MCHC: 33.7 g/dL (ref 30.0–36.0)
MCV: 95.4 fl (ref 78.0–100.0)
Monocytes Absolute: 0.5 10*3/uL (ref 0.1–1.0)
Monocytes Relative: 7 % (ref 3.0–12.0)
Neutro Abs: 4.4 10*3/uL (ref 1.4–7.7)
Neutrophils Relative %: 58.8 % (ref 43.0–77.0)
Platelets: 269 10*3/uL (ref 150.0–400.0)
RBC: 4.62 Mil/uL (ref 3.87–5.11)
RDW: 13 % (ref 11.5–15.5)
WBC: 7.5 10*3/uL (ref 4.0–10.5)

## 2022-01-25 LAB — COMPREHENSIVE METABOLIC PANEL
ALT: 15 U/L (ref 0–35)
AST: 18 U/L (ref 0–37)
Albumin: 4.8 g/dL (ref 3.5–5.2)
Alkaline Phosphatase: 60 U/L (ref 39–117)
BUN: 13 mg/dL (ref 6–23)
CO2: 30 mEq/L (ref 19–32)
Calcium: 10.1 mg/dL (ref 8.4–10.5)
Chloride: 101 mEq/L (ref 96–112)
Creatinine, Ser: 0.62 mg/dL (ref 0.40–1.20)
GFR: 121.94 mL/min (ref >60.00)
Glucose, Bld: 80 mg/dL (ref 70–99)
Potassium: 4.5 mEq/L (ref 3.5–5.1)
Sodium: 137 mEq/L (ref 135–145)
Total Bilirubin: 0.6 mg/dL (ref 0.2–1.2)
Total Protein: 8 g/dL (ref 6.0–8.3)

## 2022-01-25 LAB — TSH: TSH: 1.34 u[IU]/mL (ref 0.35–5.50)

## 2022-01-25 LAB — HEMOGLOBIN A1C: Hgb A1c MFr Bld: 5.1 % (ref 4.6–6.5)

## 2022-01-25 NOTE — Progress Notes (Signed)
? ?Complete physical exam ? ?Patient: Melanie Maldonado   DOB: 1996-07-02   26 y.o. Female  MRN: [id number] ? ?Subjective:  ?  ?Chief Complaint  ?Patient presents with  ? Annual Exam  ? ? ?Melanie Maldonado is a 26 y.o. female who presents today for a complete physical exam. She reports consuming a general diet.  She generally feels well. She reports sleeping well. She does have additional problems to discuss today including recent pruritis without a rash. This is diffuse. No known insect bites. No allergy symptoms. No new foods or medications.  ? ?She has a 74 month old. Sh denies breast feeding.  ?She is married. 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  ? ? ?Previous visit was to discuss elevated blood sugar, 110. No hx of diabetes. She did not have labs done.  ? ? ?Most recent fall risk assessment: ? ?  01/18/2022  ?  1:36 PM  ?Fall Risk   ?Falls in the past year? 0  ?Number falls in past yr: 0  ?Injury with Fall? 0  ?Risk for fall due to : No Fall Risks  ?Follow up Falls evaluation completed  ? ?  ?Most recent depression screenings: ? ?  01/18/2022  ?  1:36 PM  ?PHQ 2/9 Scores  ?PHQ - 2 Score 0  ? ? ?Vision:Not within last year  and Dental: No current dental problems and Receives regular dental care ? ?Patient Active Problem List  ? Diagnosis Date Noted  ? Routine general medical examination at a health care facility 01/25/2022  ? Pruritus 01/25/2022  ? Elevated blood sugar 01/25/2022  ? ?Past Medical History:  ?Diagnosis Date  ? Medical history non-contributory   ? Supervision of low-risk first pregnancy 10/05/2020  ? ?Past Surgical History:  ?Procedure Laterality Date  ? NO PAST SURGERIES    ? ?Social History  ? ?Tobacco Use  ? Smoking status: Never  ? Smokeless tobacco: Never  ?Vaping Use  ? Vaping Use: Never used  ?Substance Use Topics  ? Alcohol use: Never  ? Drug use: Never  ? ?Family Status  ?Relation Name Status  ? Mother  Alive  ? Father  Alive  ? Brother  Alive  ? MGM  Alive  ? MGF  Deceased  ? PGM  Alive  ? PGF   Deceased  ? ?No family history on file. ?No Known Allergies ?  ? ?Patient Care Team: ?Girtha Rm, NP-C as PCP - General (Family Medicine)  ? ?Outpatient Medications Prior to Visit  ?Medication Sig  ? Multiple Vitamins-Minerals (MULTIVITAMIN WOMEN PO) Take by mouth.  ? ?No facility-administered medications prior to visit.  ? ? ?ROS ?Review of Systems ?Constitutional: -fever, -chills, -sweats, -unexpected weight change,-fatigue ?ENT: -runny nose, -ear pain, -sore throat ?Cardiology:  -chest pain, -palpitations, -edema ?Respiratory: -cough, -shortness of breath, -wheezing ?Gastroenterology: -abdominal pain, -nausea, -vomiting, -diarrhea, -constipation  ?Hematology: -bleeding or bruising problems ?Musculoskeletal: -arthralgias, -myalgias, -joint swelling, -back pain ?Ophthalmology: -vision changes ?Urology: -dysuria, -difficulty urinating, -hematuria, -urinary frequency, -urgency ?Neurology: -headache, -weakness, -tingling, -numbness ?  ? ? ?   ?Objective:  ? ?  ?BP 116/62 (BP Location: Left Arm, Patient Position: Sitting, Cuff Size: Large)   Pulse 75   Temp 97.8 ?F (36.6 ?C) (Temporal)   Ht _0  (1.575 m)   Wt 122 lb (55.3 kg)   SpO2 96%   Breastfeeding No   BMI 22.31 kg/m?  ?BP Readings from Last 3 Encounters:  ?01/25/22 116/62  ?01/18/22 106/62  ?  10/06/20 (!) 90/59  ? ?Wt Readings from Last 3 Encounters:  ?01/25/22 122 lb (55.3 kg)  ?01/18/22 122 lb 6.4 oz (55.5 kg)  ?10/05/20 136 lb 3.2 oz (61.8 kg)  ? ?  ? ?Physical Exam ?Constitutional:   ?   General: She is not in acute distress. ?   Appearance: Normal appearance. She is not ill-appearing.  ?HENT:  ?   Right Ear: Tympanic membrane and ear canal normal.  ?   Left Ear: Tympanic membrane and ear canal normal.  ?   Mouth/Throat:  ?   Mouth: Mucous membranes are moist.  ?Eyes:  ?   Extraocular Movements: Extraocular movements intact.  ?   Conjunctiva/sclera: Conjunctivae normal.  ?   Pupils: Pupils are equal, round, and reactive to light.   ?Cardiovascular:  ?   Rate and Rhythm: Normal rate and regular rhythm.  ?   Pulses: Normal pulses.  ?   Heart sounds: Normal heart sounds.  ?Pulmonary:  ?   Effort: Pulmonary effort is normal.  ?   Breath sounds: Normal breath sounds.  ?Abdominal:  ?   General: Abdomen is flat. Bowel sounds are normal. There is no distension.  ?   Palpations: Abdomen is soft.  ?   Tenderness: There is no abdominal tenderness. There is no guarding or rebound.  ?Musculoskeletal:     ?   General: Normal range of motion.  ?   Cervical back: Normal range of motion and neck supple.  ?Lymphadenopathy:  ?   Cervical: No cervical adenopathy.  ?Skin: ?   General: Skin is warm and dry.  ?   Findings: No rash.  ?Neurological:  ?   General: No focal deficit present.  ?   Mental Status: She is alert and oriented to person, place, and time.  ?   Cranial Nerves: No cranial nerve deficit.  ?   Sensory: No sensory deficit.  ?   Motor: No weakness.  ?   Coordination: Coordination normal.  ?Psychiatric:     ?   Mood and Affect: Mood normal.     ?   Behavior: Behavior normal.     ?   Thought Content: Thought content normal.  ?  ? ?Results for orders placed or performed in visit on 01/25/22  ?TSH  ?Result Value Ref Range  ? TSH 1.34 0.35 - 5.50 uIU/mL  ?Comprehensive metabolic panel  ?Result Value Ref Range  ? Sodium 137 135 - 145 mEq/L  ? Potassium 4.5 3.5 - 5.1 mEq/L  ? Chloride 101 96 - 112 mEq/L  ? CO2 30 19 - 32 mEq/L  ? Glucose, Bld 80 70 - 99 mg/dL  ? BUN 13 6 - 23 mg/dL  ? Creatinine, Ser 0.62 0.40 - 1.20 mg/dL  ? Total Bilirubin 0.6 0.2 - 1.2 mg/dL  ? Alkaline Phosphatase 60 39 - 117 U/L  ? AST 18 0 - 37 U/L  ? ALT 15 0 - 35 U/L  ? Total Protein 8.0 6.0 - 8.3 g/dL  ? Albumin 4.8 3.5 - 5.2 g/dL  ? GFR 121.94 >60.00 mL/min  ? Calcium 10.1 8.4 - 10.5 mg/dL  ?CBC with Differential/Platelet  ?Result Value Ref Range  ? WBC 7.5 4.0 - 10.5 K/uL  ? RBC 4.62 3.87 - 5.11 Mil/uL  ? Hemoglobin 14.8 12.0 - 15.0 g/dL  ? HCT 44.1 36.0 - 46.0 %  ? MCV 95.4 78.0  - 100.0 fl  ? MCHC 33.7 30.0 - 36.0 g/dL  ? RDW 13.0  11.5 - 15.5 %  ? Platelets 269.0 150.0 - 400.0 K/uL  ? Neutrophils Relative % 58.8 43.0 - 77.0 %  ? Lymphocytes Relative 31.9 12.0 - 46.0 %  ? Monocytes Relative 7.0 3.0 - 12.0 %  ? Eosinophils Relative 1.9 0.0 - 5.0 %  ? Basophils Relative 0.4 0.0 - 3.0 %  ? Neutro Abs 4.4 1.4 - 7.7 K/uL  ? Lymphs Abs 2.4 0.7 - 4.0 K/uL  ? Monocytes Absolute 0.5 0.1 - 1.0 K/uL  ? Eosinophils Absolute 0.1 0.0 - 0.7 K/uL  ? Basophils Absolute 0.0 0.0 - 0.1 K/uL  ?Hemoglobin A1c  ?Result Value Ref Range  ? Hgb A1c MFr Bld 5.1 4.6 - 6.5 %  ? ?Last CBC ?Lab Results  ?Component Value Date  ? WBC 7.5 01/25/2022  ? HGB 14.8 01/25/2022  ? HCT 44.1 01/25/2022  ? MCV 95.4 01/25/2022  ? MCH 33.3 10/05/2020  ? RDW 13.0 01/25/2022  ? PLT 269.0 01/25/2022  ? ?Last metabolic panel ?Lab Results  ?Component Value Date  ? GLUCOSE 80 01/25/2022  ? NA 137 01/25/2022  ? K 4.5 01/25/2022  ? CL 101 01/25/2022  ? CO2 30 01/25/2022  ? BUN 13 01/25/2022  ? CREATININE 0.62 01/25/2022  ? CALCIUM 10.1 01/25/2022  ? PROT 8.0 01/25/2022  ? ALBUMIN 4.8 01/25/2022  ? BILITOT 0.6 01/25/2022  ? ALKPHOS 60 01/25/2022  ? AST 18 01/25/2022  ? ALT 15 01/25/2022  ? ?Last lipids ?No results found for: CHOL, HDL, LDLCALC, LDLDIRECT, TRIG, CHOLHDL ?Last hemoglobin A1c ?Lab Results  ?Component Value Date  ? HGBA1C 5.1 01/25/2022  ? ?Last thyroid functions ?Lab Results  ?Component Value Date  ? TSH 1.34 01/25/2022  ? ?Last vitamin D ?No results found for: 25OHVITD2, 25OHVITD3, VD25OH ?  ?   ?Assessment & Plan:  ?  ?Routine Health Maintenance and Physical Exam ? ?Immunization History  ?Administered Date(s) Administered  ? Hepatitis B 04/22/2013, 10/17/2013  ? Influenza-Unspecified 09/07/2020  ? MMR 05/04/2013, 06/09/2013  ? PFIZER(Purple Top)SARS-COV-2 Vaccination 04/04/2020, 04/25/2020  ? Pension scheme manager 87yr & up 02/13/2021, 07/14/2021  ? Td 04/23/2013, 06/09/2013, 01/01/2014, 09/05/2020   ? ? ?Health Maintenance  ?Topic Date Due  ? PAP-Cervical Cytology Screening  Never done  ? PAP SMEAR-Modifier  Never done  ? INFLUENZA VACCINE  04/11/2022  ? TETANUS/TDAP  09/05/2030  ? COVID-19 Vaccine  Completed  ? Hepatitis C Scr

## 2022-01-25 NOTE — Patient Instructions (Addendum)
?Go to the first floor now to get labs done.  ? ?Try over the counter Claritin, Allegra, Zyrtec or Xyzal for the next week or two to see if it helps with the itching.  ?You can use cool compresses as well.  ? ?We will be in touch with your lab results.  ? ? ?Obgyn Offices:  ? ?[facility] ?[address] ?[address] ?[place], [place] ?[phone] ? ?[facility] ?Address: [address] ?[place], [place] [address] ?Phone: [phone] ? ?[facility] ?[address] ?[address] ?[place], [place] [address] ?Phone: [phone] ? ?[facility] ?[address] ?[place], [place] [address] ?Phone: [phone]  ? ? ?Preventive Care 20-61 Years Old, Female ?Preventive care refers to lifestyle choices and visits with your health care provider that can promote health and wellness. Preventive care visits are also called wellness exams. ?What can I expect for my preventive care visit? ?Counseling ?During your preventive care visit, your health care provider may ask about your: ?Medical history, including: ?Past medical problems. ?Family medical history. ?Pregnancy history. ?Current health, including: ?Menstrual cycle. ?Method of birth control. ?Emotional well-being. ?Home life and relationship well-being. ?Sexual activity and sexual health. ?Lifestyle, including: ?Alcohol, nicotine or tobacco, and drug use. ?Access to firearms. ?Diet, exercise, and sleep habits. ?Work and work Statistician. ?Sunscreen use. ?Safety issues such as seatbelt and bike helmet use. ?Physical exam ?Your health care provider may check your: ?Height and weight. These may be used to calculate your BMI (body mass index). BMI is a measurement that tells if you are at a healthy weight. ?Waist circumference. This measures the distance around your waistline. This measurement also tells if you are at a healthy weight and may help predict your risk of certain diseases, such as type 2  diabetes and high blood pressure. ?Heart rate and blood pressure. ?Body temperature. ?Skin for abnormal spots. ?What immunizations do I need? ? ?Vaccines are usually given at various ages, according to a schedule. Your health care provider will recommend vaccines for you based on your age, medical history, and lifestyle or other factors, such as travel or where you work. ?What tests do I need? ?Screening ?Your health care provider may recommend screening tests for certain conditions. This may include: ?Pelvic exam and Pap test. ?Lipid and cholesterol levels. ?Diabetes screening. This is done by checking your blood sugar (glucose) after you have not eaten for a while (fasting). ?Hepatitis B test. ?Hepatitis C test. ?HIV (human immunodeficiency virus) test. ?STI (sexually transmitted infection) testing, if you are at risk. ?BRCA-related cancer screening. This may be done if you have a family history of breast, ovarian, tubal, or peritoneal cancers. ?Talk with your health care provider about your test results, treatment options, and if necessary, the need for more tests. ?Follow these instructions at home: ?Eating and drinking ? ?Eat a healthy diet that includes fresh fruits and vegetables, whole grains, lean protein, and low-fat dairy products. ?Take vitamin and mineral supplements as recommended by your health care provider. ?Do not drink alcohol if: ?Your health care provider tells you not to drink. ?You are pregnant, may be pregnant, or are planning to become pregnant. ?If you drink alcohol: ?Limit how much you have to 0-1 drink a day. ?Know how much alcohol is in your drink. In the [place] one drink equals one 12 oz bottle of beer (355 mL), one 5 oz glass of wine (148 mL), or one 1? oz glass of hard liquor (44  mL). ?Lifestyle ?Brush your teeth every morning and night with fluoride toothpaste. Floss one time each day. ?Exercise for at least 30 minutes 5 or more days each week. ?Do not use any products that contain  nicotine or tobacco. These products include cigarettes, chewing tobacco, and vaping devices, such as e-cigarettes. If you need help quitting, ask your health care provider. ?Do not use drugs. ?If you are sexually active, practice safe sex. Use a condom or other form of protection to prevent STIs. ?If you do not wish to become pregnant, use a form of birth control. If you plan to become pregnant, see your health care provider for a prepregnancy visit. ?Find healthy ways to manage stress, such as: ?Meditation, yoga, or listening to music. ?Journaling. ?Talking to a trusted person. ?Spending time with friends and family. ?Minimize exposure to UV radiation to reduce your risk of skin cancer. ?Safety ?Always wear your seat belt while driving or riding in a vehicle. ?Do not drive: ?If you have been drinking alcohol. Do not ride with someone who has been drinking. ?If you have been using any mind-altering substances or drugs. ?While texting. ?When you are tired or distracted. ?Wear a helmet and other protective equipment during sports activities. ?If you have firearms in your house, make sure you follow all gun safety procedures. ?Seek help if you have been physically or sexually abused. ?What's next? ?Go to your health care provider once a year for an annual wellness visit. ?Ask your health care provider how often you should have your eyes and teeth checked. ?Stay up to date on all vaccines. ?This information is not intended to replace advice given to you by your health care provider. Make sure you discuss any questions you have with your health care provider. ?Document Revised: 02/23/2021 Document Reviewed: 02/23/2021 ?Elsevier Patient Education ? [place]. ? ?

## 2022-01-25 NOTE — Assessment & Plan Note (Signed)
Preventive health care reviewed.  She will call and schedule with an OB/GYN.  Recommend regular dental and eye exams as needed.  Counseling on healthy lifestyle including diet and exercise.  Immunizations reviewed.  Discussed safety and health promotion. ?

## 2022-01-25 NOTE — Assessment & Plan Note (Signed)
Unclear etiology.  No rash or insect bites observed.  Try over-the-counter nondrowsy antihistamine and follow-up. ?

## 2022-01-25 NOTE — Assessment & Plan Note (Signed)
Check hemoglobin A1c and follow-up 

## 2023-04-25 ENCOUNTER — Ambulatory Visit (INDEPENDENT_AMBULATORY_CARE_PROVIDER_SITE_OTHER): Payer: 59 | Admitting: Family Medicine

## 2023-04-25 ENCOUNTER — Encounter: Payer: Self-pay | Admitting: Family Medicine

## 2023-04-25 VITALS — BP 102/64 | HR 60 | Temp 97.6°F | Ht 62.0 in | Wt 123.0 lb

## 2023-04-25 DIAGNOSIS — Z0001 Encounter for general adult medical examination with abnormal findings: Secondary | ICD-10-CM | POA: Diagnosis not present

## 2023-04-25 DIAGNOSIS — Z801 Family history of malignant neoplasm of trachea, bronchus and lung: Secondary | ICD-10-CM | POA: Diagnosis not present

## 2023-04-25 DIAGNOSIS — Z124 Encounter for screening for malignant neoplasm of cervix: Secondary | ICD-10-CM | POA: Diagnosis not present

## 2023-04-25 LAB — CBC WITH DIFFERENTIAL/PLATELET
Basophils Absolute: 0 10*3/uL (ref 0.0–0.1)
Basophils Relative: 0.4 % (ref 0.0–3.0)
Eosinophils Absolute: 0.1 10*3/uL (ref 0.0–0.7)
Eosinophils Relative: 1.8 % (ref 0.0–5.0)
HCT: 44.3 % (ref 36.0–46.0)
Hemoglobin: 14.5 g/dL (ref 12.0–15.0)
Lymphocytes Relative: 30.8 % (ref 12.0–46.0)
Lymphs Abs: 2.3 10*3/uL (ref 0.7–4.0)
MCHC: 32.7 g/dL (ref 30.0–36.0)
MCV: 94.8 fl (ref 78.0–100.0)
Monocytes Absolute: 0.5 10*3/uL (ref 0.1–1.0)
Monocytes Relative: 6.3 % (ref 3.0–12.0)
Neutro Abs: 4.6 10*3/uL (ref 1.4–7.7)
Neutrophils Relative %: 60.7 % (ref 43.0–77.0)
Platelets: 283 10*3/uL (ref 150.0–400.0)
RBC: 4.67 Mil/uL (ref 3.87–5.11)
RDW: 12.3 % (ref 11.5–15.5)
WBC: 7.5 10*3/uL (ref 4.0–10.5)

## 2023-04-25 LAB — COMPREHENSIVE METABOLIC PANEL
ALT: 14 U/L (ref 0–35)
AST: 16 U/L (ref 0–37)
Albumin: 4.7 g/dL (ref 3.5–5.2)
Alkaline Phosphatase: 57 U/L (ref 39–117)
BUN: 16 mg/dL (ref 6–23)
CO2: 31 mEq/L (ref 19–32)
Calcium: 10.3 mg/dL (ref 8.4–10.5)
Chloride: 100 mEq/L (ref 96–112)
Creatinine, Ser: 0.61 mg/dL (ref 0.40–1.20)
GFR: 121.36 mL/min (ref >60.00)
Glucose, Bld: 91 mg/dL (ref 70–99)
Potassium: 4.7 mEq/L (ref 3.5–5.1)
Sodium: 136 mEq/L (ref 135–145)
Total Bilirubin: 0.5 mg/dL (ref 0.2–1.2)
Total Protein: 8 g/dL (ref 6.0–8.3)

## 2023-04-25 NOTE — Progress Notes (Signed)
Complete physical exam  Patient: Melanie Maldonado   DOB: Oct 16, 1995   27 y.o. Female  MRN: [id number]  Subjective:    Chief Complaint  Patient presents with   Annual Exam    Not fasting   She is here for a complete physical exam.  Maternal grandfather with lung cancer in 59s.  Maternal aunt with lung cancer and died 42 years.  Maternal aunt with lung cancer diagnosed at 39 years.  Kidney cancer in cousin   Would like referral for genetic testing.     Health Maintenance  Topic Date Due   Pap Smear  Never done   Pap Smear  Never done   Flu Shot  04/12/2023   COVID-19 Vaccine (5 - 2023-24 season) 05/11/2023*   DTaP/Tdap/Td vaccine (5 - Tdap) 09/05/2030   Hepatitis C Screening  Completed   HIV Screening  Completed   HPV Vaccine  Aged Out  *Topic was postponed. The date shown is not the original due date.    Wears seatbelt always, uses sunscreen, smoke detectors in home and functioning, does not text while driving, feels safe in home environment.  Depression screening:    04/25/2023    2:59 PM 01/18/2022    1:36 PM  Depression screen PHQ 2/9  Decreased Interest 0 0  Down, Depressed, Hopeless 0 0  PHQ - 2 Score 0 0   Anxiety Screening:     No data to display          Vision:Not within last year  and Dental: No current dental problems and Receives regular dental care  Patient Active Problem List   Diagnosis Date Noted   Routine general medical examination at a health care facility 01/25/2022   Pruritus 01/25/2022   Elevated blood sugar 01/25/2022   Past Medical History:  Diagnosis Date   Medical history non-contributory    Supervision of low-risk first pregnancy 10/05/2020   Past Surgical History:  Procedure Laterality Date   NO PAST SURGERIES     Social History   Tobacco Use   Smoking status: Never   Smokeless tobacco: Never  Vaping Use   Vaping status: Never Used  Substance Use Topics   Alcohol use: Never   Drug use: Never      Patient Care  Team: Avanell Shackleton, NP-C as PCP - General (Family Medicine)   Outpatient Medications Prior to Visit  Medication Sig   Multiple Vitamins-Minerals (MULTIVITAMIN WOMEN PO) Take by mouth.   No facility-administered medications prior to visit.    Review of Systems  Constitutional:  Negative for chills and fever.  HENT:  Negative for sore throat.   Eyes:  Negative for blurred vision, double vision and pain.  Respiratory:  Negative for cough, shortness of breath and wheezing.   Cardiovascular:  Negative for chest pain, palpitations and leg swelling.  Gastrointestinal:  Negative for abdominal pain, constipation, diarrhea, nausea and vomiting.  Genitourinary:  Negative for dysuria, frequency and urgency.  Musculoskeletal:  Negative for back pain, joint pain and myalgias.  Skin:  Negative for rash.  Neurological:  Negative for dizziness, tingling, focal weakness and headaches.  Endo/Heme/Allergies:  Does not bruise/bleed easily.  Psychiatric/Behavioral:  Negative for depression and suicidal ideas. The patient is not nervous/anxious.        Objective:    BP 102/64 (BP Location: Left Arm, Patient Position: Sitting, Cuff Size: Normal)   Pulse 60   Temp 97.6 F (36.4 C) (Temporal)   Ht 5\' 2"  (1.575 m)  Wt 123 lb (55.8 kg)   SpO2 97%   BMI 22.50 kg/m  BP Readings from Last 3 Encounters:  04/25/23 102/64  01/25/22 116/62  01/18/22 106/62   Wt Readings from Last 3 Encounters:  04/25/23 123 lb (55.8 kg)  01/25/22 122 lb (55.3 kg)  01/18/22 122 lb 6.4 oz (55.5 kg)    Physical Exam Constitutional:      General: She is not in acute distress. HENT:     Right Ear: Tympanic membrane, ear canal and external ear normal.     Left Ear: Tympanic membrane, ear canal and external ear normal.     Nose: Nose normal.     Mouth/Throat:     Mouth: Mucous membranes are moist.     Pharynx: Oropharynx is clear.  Eyes:     Extraocular Movements: Extraocular movements intact.      Conjunctiva/sclera: Conjunctivae normal.     Pupils: Pupils are equal, round, and reactive to light.  Neck:     Thyroid: No thyroid mass, thyromegaly or thyroid tenderness.  Cardiovascular:     Rate and Rhythm: Normal rate and regular rhythm.     Pulses: Normal pulses.     Heart sounds: Normal heart sounds.  Pulmonary:     Effort: Pulmonary effort is normal.     Breath sounds: Normal breath sounds.  Abdominal:     General: Bowel sounds are normal.     Palpations: Abdomen is soft.     Tenderness: There is no abdominal tenderness. There is no right CVA tenderness, left CVA tenderness, guarding or rebound.  Musculoskeletal:        General: Normal range of motion.     Cervical back: Normal range of motion and neck supple. No tenderness.     Right lower leg: No edema.     Left lower leg: No edema.  Lymphadenopathy:     Cervical: No cervical adenopathy.  Skin:    General: Skin is warm and dry.     Findings: No lesion or rash.  Neurological:     General: No focal deficit present.     Mental Status: She is alert and oriented to person, place, and time.     Cranial Nerves: No cranial nerve deficit.     Sensory: No sensory deficit.     Motor: No weakness.     Gait: Gait normal.  Psychiatric:        Mood and Affect: Mood normal.        Behavior: Behavior normal.        Thought Content: Thought content normal.      Results for orders placed or performed in visit on 04/25/23  CBC with Differential/Platelet  Result Value Ref Range   WBC 7.5 4.0 - 10.5 K/uL   RBC 4.67 3.87 - 5.11 Mil/uL   Hemoglobin 14.5 12.0 - 15.0 g/dL   HCT 16.1 09.6 - 04.5 %   MCV 94.8 78.0 - 100.0 fl   MCHC 32.7 30.0 - 36.0 g/dL   RDW 40.9 81.1 - 91.4 %   Platelets 283.0 150.0 - 400.0 K/uL   Neutrophils Relative % 60.7 43.0 - 77.0 %   Lymphocytes Relative 30.8 12.0 - 46.0 %   Monocytes Relative 6.3 3.0 - 12.0 %   Eosinophils Relative 1.8 0.0 - 5.0 %   Basophils Relative 0.4 0.0 - 3.0 %   Neutro Abs 4.6  1.4 - 7.7 K/uL   Lymphs Abs 2.3 0.7 - 4.0 K/uL   Monocytes Absolute  0.5 0.1 - 1.0 K/uL   Eosinophils Absolute 0.1 0.0 - 0.7 K/uL   Basophils Absolute 0.0 0.0 - 0.1 K/uL  Comprehensive metabolic panel  Result Value Ref Range   Sodium 136 135 - 145 mEq/L   Potassium 4.7 3.5 - 5.1 mEq/L   Chloride 100 96 - 112 mEq/L   CO2 31 19 - 32 mEq/L   Glucose, Bld 91 70 - 99 mg/dL   BUN 16 6 - 23 mg/dL   Creatinine, Ser 9.52 0.40 - 1.20 mg/dL   Total Bilirubin 0.5 0.2 - 1.2 mg/dL   Alkaline Phosphatase 57 39 - 117 U/L   AST 16 0 - 37 U/L   ALT 14 0 - 35 U/L   Total Protein 8.0 6.0 - 8.3 g/dL   Albumin 4.7 3.5 - 5.2 g/dL   GFR 841.32 >44.01 mL/min   Calcium 10.3 8.4 - 10.5 mg/dL      Assessment & Plan:    Routine Health Maintenance and Physical Exam  Problem List Items Addressed This Visit   None Visit Diagnoses     Encounter for general adult medical examination with abnormal findings    -  Primary   Relevant Orders   Comprehensive metabolic panel (Completed)   CBC with Differential/Platelet (Completed)   Screening for cervical cancer       Relevant Orders   Ambulatory referral to Obstetrics / Gynecology   Family history of lung cancer       Relevant Orders   Ambulatory referral to Genetics      Preventive health care reviewed.  Referral to OB/GYN. Counseling on healthy lifestyle including diet and exercise.  Recommend regular dental and eye exams.  Immunizations reviewed.  Discussed safety.  Return in about 1 year (around 04/24/2024).     Hetty Blend, NP-C

## 2023-04-25 NOTE — Patient Instructions (Signed)
I have referred you to an OB/GYN for breast and pelvic exams with pap smear screening.   I also referred you for genetic testing due to your family history.

## 2023-05-02 ENCOUNTER — Telehealth: Payer: Self-pay | Admitting: Genetic Counselor

## 2023-05-02 NOTE — Telephone Encounter (Signed)
 Left patient a message regarding scheduled appointment times/dates for [facility]

## 2023-05-29 ENCOUNTER — Other Ambulatory Visit: Payer: Self-pay | Admitting: Genetic Counselor

## 2023-05-29 ENCOUNTER — Encounter: Payer: Self-pay | Admitting: Genetic Counselor

## 2023-05-29 ENCOUNTER — Inpatient Hospital Stay: Payer: 59

## 2023-05-29 ENCOUNTER — Inpatient Hospital Stay: Payer: 59 | Admitting: Genetic Counselor

## 2023-05-29 ENCOUNTER — Inpatient Hospital Stay: Payer: 59 | Attending: Genetic Counselor | Admitting: Genetic Counselor

## 2023-05-29 DIAGNOSIS — Z8051 Family history of malignant neoplasm of kidney: Secondary | ICD-10-CM | POA: Diagnosis not present

## 2023-05-29 DIAGNOSIS — Z801 Family history of malignant neoplasm of trachea, bronchus and lung: Secondary | ICD-10-CM

## 2023-05-29 DIAGNOSIS — Z1379 Encounter for other screening for genetic and chromosomal anomalies: Secondary | ICD-10-CM

## 2023-05-29 LAB — GENETIC SCREENING ORDER

## 2023-05-29 NOTE — Progress Notes (Deleted)
REFERRING PROVIDER: Avanell Shackleton, NP-C [address] [place],  [place] [address]  PRIMARY PROVIDER:  Avanell Shackleton, NP-C  PRIMARY REASON FOR VISIT:  ***  HISTORY OF PRESENT ILLNESS:   Ms. Klimaszewski, a 27 y.o. female, was seen for a [place] cancer genetics consultation at the request of Dr. Suezanne Jacquet due to a {Personal/family:20331} history of {cancer/polyps}.  Ms. Nanney presents to clinic today to discuss the possibility of a hereditary predisposition to cancer, to discuss genetic testing, and to further clarify her future cancer risks, as well as potential cancer risks for family members.   In ***, at the age of ***, Ms. Yorker was diagnosed with {CA PATHOLOGY:63853} of the {right left (wildcard):15202} {CA XBJYN:82956}. The treatment plan ***.    *** Ms. Menzies is a 27 y.o. female with no personal history of cancer.    CANCER HISTORY:  Oncology History   No history exists.     RISK FACTORS:  Menarche was at age ***.  First live birth at age ***.  OCP use for approximately {Numbers 1-12 multi-select:20307} years.  Ovaries intact: {Yes/No-Ex:120004}.  Uterus intact: {Yes/No-Ex:120004}.  Menopausal status: {Menopause:31378}.  HRT use: {Numbers 1-12 multi-select:20307} years. Colonoscopy: {Yes/No-Ex:120004}; {normal/abnormal/not examined:14677}. Mammogram within the last year: {Yes/No-Ex:120004}. Number of breast biopsies: {Numbers 1-12 multi-select:20307}. Up to date with pelvic exams: {Yes/No-Ex:120004}. Any excessive radiation exposure in the past: {Yes/No-Ex:120004}  Past Medical History:  Diagnosis Date   Medical history non-contributory    Supervision of low-risk first pregnancy 10/05/2020    Past Surgical History:  Procedure Laterality Date   NO PAST SURGERIES      Social History   Socioeconomic History   Marital status: Married    Spouse name: Not on file   Number of children: Not on file   Years of education: Not on file   Highest education level:  Not on file  Occupational History   Not on file  Tobacco Use   Smoking status: Never   Smokeless tobacco: Never  Vaping Use   Vaping status: Never Used  Substance and Sexual Activity   Alcohol use: Never   Drug use: Never   Sexual activity: Yes    Birth control/protection: None  Other Topics Concern   Not on file  Social History Narrative   Not on file   Social Determinants of Health   Financial Resource Strain: Not on file  Food Insecurity: Not on file  Transportation Needs: Not on file  Physical Activity: Not on file  Stress: Not on file  Social Connections: Not on file     FAMILY HISTORY:  We obtained a detailed, 4-generation family history.  Significant diagnoses are listed below: Family History  Problem Relation Age of Onset   Cancer Maternal Grandmother    Cancer Maternal Aunt    Cancer Maternal Aunt    Cancer Cousin     Ms. Moos is {aware/unaware} of previous family history of genetic testing for hereditary cancer risks. Patient's maternal ancestors are of *** descent, and paternal ancestors are of *** descent. There {IS NO:12509} reported Ashkenazi Jewish ancestry. There {IS NO:12509} known consanguinity.  GENETIC COUNSELING ASSESSMENT: Ms. Lanie is a 27 y.o. female with a {Personal/family:20331} history of {cancer/polyps} which is somewhat suggestive of a hereditary predisposition to cancer given ***. We, therefore, discussed and recommended the following at today's visit.   DISCUSSION: We discussed that 5 - 10% of cancer is hereditary, with most cases of hereditary *** cancer associated with ***.  There are  other genes that can be associated with hereditary *** cancer syndromes.  We discussed that testing is beneficial for several reasons, including knowing about other cancer risks, identifying potential screening and risk-reduction options that may be appropriate, and to understanding if other family members could be at risk for cancer and allowing them to  undergo genetic testing.  We reviewed the characteristics, features and inheritance patterns of hereditary cancer syndromes. We also discussed genetic testing, including the appropriate family members to test, the process of testing, insurance coverage and turn-around-time for results. We discussed the implications of a negative, positive, carrier and/or variant of uncertain significant result. We discussed that negative results would be uninformative given that Ms. Dabel does not have a personal history of cancer. We recommended Ms. Rotar pursue genetic testing for a panel that contains genes associated with ***.  Ms. Clingman was offered a common hereditary cancer panel (34 genes) and an expanded pan-cancer panel (71 genes). Ms. East was informed of the benefits and limitations of each panel, including that expanded pan-cancer panels contain several genes that do not have clear management guidelines at this point in time.  We also discussed that as the number of genes included on a panel increases, the chances of variants of uncertain significance increases.  After considering the benefits and limitations of each gene panel, Ms. Uvalle elected to have ***.   Based on Ms. Vasco's {Personal/family:20331} history of cancer, she meets medical criteria for genetic testing. Despite that she meets criteria, she may still have an out of pocket cost. We discussed that if ***her out of pocket cost for testing is over $100, the laboratory should contact them to discuss self-pay prices, patient pay assistance programs, if applicable, and other billing options.  ***We discussed with Ms. Holihan that the {Personal/family:20331} history does not meet insurance or NCCN criteria for genetic testing and, therefore, is not highly consistent with a familial hereditary cancer syndrome.  We feel she is at low risk to harbor a gene mutation associated with such a condition. Thus, we did not recommend any genetic testing, at  this time, and recommended Ms. Chatfield continue to follow the cancer screening guidelines given by her primary healthcare provider.  We discussed that some people do not want to undergo genetic testing due to fear of genetic discrimination.  A federal law called the Genetic Information Non-Discrimination Act (GINA) of 2008 helps protect individuals against genetic discrimination based on their genetic test results.  It impacts both health insurance and employment.  With health insurance, it protects against increased premiums, being kicked off insurance or being forced to take a test in order to be insured.  For employment it protects against hiring, firing and promoting decisions based on genetic test results.  GINA does not apply to those in the [employer], those who work for companies with less than 15 employees, and new life insurance or long-term disability insurance policies.  Health status due to a cancer diagnosis is not protected under GINA.  PLAN: After considering the risks, benefits, and limitations, Ms. Cartwright provided informed consent to pursue genetic testing and the blood sample was sent to {Lab} Laboratories for analysis of the {test}. Results should be available within approximately {TAT TIME} weeks' time, at which point they will be disclosed by telephone to Ms. Compton, as will any additional recommendations warranted by these results. Ms. Soberon will receive a summary of her genetic counseling visit and a copy of her results once available. This information will also be  available in Epic.   *** Despite our recommendation, Ms. Waye did not wish to pursue genetic testing at today's visit. We understand this decision and remain available to coordinate genetic testing at any time in the future. We, therefore, recommend Ms. Schuerger continue to follow the cancer screening guidelines given by her primary healthcare provider.  ***Based on Ms. Diedrich's family history, we recommended her ***, who was  diagnosed with *** at age ***, have genetic counseling and testing. Ms. Darpino will let us know if we can be of any assistance in coordinating genetic counseling and/or testing for this family member.   Lastly, we encouraged Ms. Mcgivney to remain in contact with cancer genetics annually so that we can continuously update the family history and inform her of any changes in cancer genetics and testing that may be of benefit for this family.   Ms. Hemmings questions were answered to her satisfaction today. Our contact information was provided should additional questions or concerns arise. Thank you for the referral and allowing [place] to share in the care of your patient.   [name], MS, [facility] Genetic Counselor [email or web address] (P) [phone]  The patient was seen for a total of *** minutes in face-to-face genetic counseling.  ***The patient brought ***.  ***The patient was seen alone.  Drs. Pamelia Hoit and/or Mosetta Putt were available to discuss this case as needed.  _______________________________________________________________________ For Office Staff:  Number of people involved in session: *** Was an Intern/ student involved with case: {YES/NO:63}

## 2023-05-29 NOTE — Progress Notes (Signed)
REFERRING PROVIDER: Avanell Shackleton, NP-C [address] [place],  [place] [address]  PRIMARY PROVIDER:  Avanell Shackleton, NP-C  PRIMARY REASON FOR VISIT:  Encounter Diagnosis  Name Primary?   Family history of lung cancer Yes   HISTORY OF PRESENT ILLNESS:   Melanie Maldonado, a 27 y.o. female, was seen for a [place] cancer genetics consultation at the request of Hetty Blend, NP-C due to a family history of cancer.  Melanie Maldonado presents to clinic today to discuss the possibility of a hereditary predisposition to cancer, to discuss genetic testing, and to further clarify her future cancer risks, as well as potential cancer risks for family members.   Melanie Maldonado is a 27 y.o. female with no personal history of cancer.    RISK FACTORS:  Menarche was at age 8-14.  First live birth at age 54.  OCP use for approximately 0 years.  Ovaries intact: yes.  Uterus intact: yes.  Menopausal status: premenopausal.  HRT use: 0 years. Colonoscopy: no Mammogram within the last year: no. Number of breast biopsies: 0. Any excessive radiation exposure in the past: no  Past Medical History:  Diagnosis Date   Medical history non-contributory    Supervision of low-risk first pregnancy 10/05/2020    Past Surgical History:  Procedure Laterality Date   NO PAST SURGERIES      Social History   Socioeconomic History   Marital status: Married    Spouse name: Not on file   Number of children: Not on file   Years of education: Not on file   Highest education level: Not on file  Occupational History   Not on file  Tobacco Use   Smoking status: Never   Smokeless tobacco: Never  Vaping Use   Vaping status: Never Used  Substance and Sexual Activity   Alcohol use: Never   Drug use: Never   Sexual activity: Yes    Birth control/protection: None  Other Topics Concern   Not on file  Social History Narrative   Not on file   Social Determinants of Health   Financial Resource Strain: Not on  file  Food Insecurity: Not on file  Transportation Needs: Not on file  Physical Activity: Not on file  Stress: Not on file  Social Connections: Not on file     FAMILY HISTORY:  We obtained a detailed, 4-generation family history.  Significant diagnoses are listed below: Family History  Problem Relation Age of Onset   Lung cancer Maternal Aunt 65       nonsmoker   Lung cancer Maternal Aunt 40       nonsmoker   Lung cancer Maternal Grandfather 16 - 90       nonsmoker   Kidney cancer Cousin 4       maternal first cousin     Melanie Maldonado is unaware of previous family history of genetic testing for hereditary cancer risks. There is no reported Ashkenazi Jewish ancestry.   GENETIC COUNSELING ASSESSMENT: Melanie Maldonado is a 27 y.o. female with a family history of cancer. We, therefore, discussed and recommended the following at today's visit.   DISCUSSION: We discussed that 5 - 10% of cancer is hereditary. We reviewed lung cancer is typically environmental, but a germline mutation in the EGFR gene is known to be associated with an increased risk for lung cancer. There are other genes that can be associated with hereditary cancer syndromes.  We discussed that testing is beneficial for several reasons, including  knowing about other cancer risks, identifying potential screening and risk-reduction options that may be appropriate, and to understanding if other family members could be at risk for cancer and allowing them to undergo genetic testing.  We reviewed the characteristics, features and inheritance patterns of hereditary cancer syndromes. We also discussed genetic testing, including the appropriate family members to test, the process of testing, insurance coverage and turn-around-time for results. We discussed the implications of a negative, positive, carrier and/or variant of uncertain significant result. We discussed that negative results would be uninformative given that Melanie Maldonado does not have  a personal history of cancer. We did not recommend genetics at this time. However, Melanie Maldonado is very interested in pursuing genetics and would like to proceed with genetic testing.   Melanie Maldonado elected to have [facility] CustomNext Panel (CancerNext+RenalNext+EGFR). The CustomNext-Cancer+RNAinsight panel offered by [employer] includes sequencing and rearrangement analysis for the following 47 genes: APC, ATM, AXIN2, BAP1, BARD1, BMPR1A, BRCA1, BRCA2, BRIP1, CDH1, CDK4, CDKN2A, CHEK2, DICER1, EGFR, EPCAM, FH, FLCN, GREM1, HOXB13, MET, MITF, MLH1, MSH2, MSH3, MSH6, MUTYH, NF1, NTHL1, PALB2, PMS2, POLD1, POLE, PTEN, RAD51C, RAD51D, SDHA, SDHB, SDHC, SDHD, SMAD4, SMARCA4, STK11, TP53, TSC1, TSC2, VHL.  Based on Melanie Maldonado's family history of cancer, she meets medical criteria for genetic testing. Despite that she meets criteria, she may still have an out of pocket cost. We discussed that if her out of pocket cost for testing is over $100, the laboratory should contact them to discuss self-pay prices, patient pay assistance programs, if applicable, and other billing options.  We discussed that some people do not want to undergo genetic testing due to fear of genetic discrimination.  A federal law called the Genetic Information Non-Discrimination Act (GINA) of 2008 helps protect individuals against genetic discrimination based on their genetic test results.  It impacts both health insurance and employment.  With health insurance, it protects against increased premiums, being kicked off insurance or being forced to take a test in order to be insured.  For employment it protects against hiring, firing and promoting decisions based on genetic test results.  GINA does not apply to those in the [employer], those who work for companies with less than 15 employees, and new life insurance or long-term disability insurance policies.  Health status due to a cancer diagnosis is not protected under GINA.  PLAN: After  considering the risks, benefits, and limitations, Melanie Maldonado provided informed consent to pursue genetic testing and the blood sample was sent to [facility] for analysis of the CustomNext Panel. Results should be available within approximately 2-3 weeks' time, at which point they will be disclosed by telephone to Melanie Maldonado, as will any additional recommendations warranted by these results. Melanie Maldonado will receive a summary of her genetic counseling visit and a copy of her results once available. This information will also be available in Epic.   Melanie Maldonado questions were answered to her satisfaction today. Our contact information was provided should additional questions or concerns arise. Thank you for the referral and allowing [place] to share in the care of your patient.   Melanie Brothers, MS, [facility] Genetic Counselor [email or web address] (P) [phone]  The patient was seen for a total of 25 minutes in face-to-face genetic counseling. The patient was seen alone.  Drs. Pamelia Hoit and/or Mosetta Putt were available to discuss this case as needed.  _______________________________________________________________________ For Office Staff:  Number of people involved in session: 1 Was an Intern/ student involved with case: no

## 2023-06-15 ENCOUNTER — Telehealth: Payer: Self-pay | Admitting: Genetic Counselor

## 2023-06-15 ENCOUNTER — Encounter: Payer: Self-pay | Admitting: Genetic Counselor

## 2023-06-15 DIAGNOSIS — Z1379 Encounter for other screening for genetic and chromosomal anomalies: Secondary | ICD-10-CM | POA: Insufficient documentation

## 2023-06-15 NOTE — Telephone Encounter (Addendum)
I contacted Melanie Maldonado to discuss her genetic testing results. No pathogenic variants were identified in the 47 genes analyzed. Detailed clinic note to follow.  The test report has been scanned into EPIC and is located under the Molecular Pathology section of the Results Review tab.  A portion of the result report is included below for reference.   [name], MS, [facility] Genetic Counselor [email or web address] (P) [phone]

## 2023-06-25 ENCOUNTER — Encounter: Payer: Self-pay | Admitting: Genetic Counselor

## 2023-07-02 ENCOUNTER — Ambulatory Visit: Payer: Self-pay | Admitting: Genetic Counselor

## 2023-07-02 ENCOUNTER — Encounter: Payer: Self-pay | Admitting: Genetic Counselor

## 2023-07-02 DIAGNOSIS — Z1379 Encounter for other screening for genetic and chromosomal anomalies: Secondary | ICD-10-CM

## 2023-07-02 NOTE — Progress Notes (Signed)
HPI:   Melanie Maldonado was previously seen in the  Cancer Genetics clinic due to a family history of cancer and concerns regarding a hereditary predisposition to cancer. Please refer to our prior cancer genetics clinic note for more information regarding our discussion, assessment and recommendations, at the time. Ms. Waage recent genetic test results were disclosed to her, as were recommendations warranted by these results. These results and recommendations are discussed in more detail below.  FAMILY HISTORY:  We obtained a detailed, 4-generation family history.  Significant diagnoses are listed below:      Family History  Problem Relation Age of Onset   Lung cancer Maternal Aunt 44        nonsmoker   Lung cancer Maternal Aunt 40        nonsmoker   Lung cancer Maternal Grandfather 74 - 53        nonsmoker   Kidney cancer Cousin 4        maternal first cousin           Ms. Tabor is unaware of previous family history of genetic testing for hereditary cancer risks. There is no reported Ashkenazi Jewish ancestry.   GENETIC TEST RESULTS:  The [facility] CustomNext Panel found no pathogenic mutations.   The CustomNext-Cancer+RNAinsight panel offered by [facility] includes sequencing and rearrangement analysis for the following 47 genes: APC, ATM, AXIN2, BAP1, BARD1, BMPR1A, BRCA1, BRCA2, BRIP1, CDH1, CDK4, CDKN2A, CHEK2, DICER1, EGFR, EPCAM, FH, FLCN, GREM1, HOXB13, MET, MITF, MLH1, MSH2, MSH3, MSH6, MUTYH, NF1, NTHL1, PALB2, PMS2, POLD1, POLE, PTEN, RAD51C, RAD51D, SDHA, SDHB, SDHC, SDHD, SMAD4, SMARCA4, STK11, TP53, TSC1, TSC2, VHL.   The test report has been scanned into EPIC and is located under the Molecular Pathology section of the Results Review tab.  A portion of the result report is included below for reference. Genetic testing reported out on 06/09/2023.       Even though a pathogenic variant was not identified, possible explanations for the cancer in the family may  include: There may be no hereditary risk for cancer in the family. The cancers in her family may be due to other genetic or environmental factors. There may be a gene mutation in one of these genes that current testing methods cannot detect, but that chance is small. There could be another gene that has not yet been discovered, or that we have not yet tested, that is responsible for the cancer diagnoses in the family.  It is also possible there is a hereditary cause for the cancer in the family that Ms. Sevick did not inherit.  Therefore, it is important to remain in touch with cancer genetics in the future so that we can continue to offer Ms. Herder the most up to date genetic testing.   ADDITIONAL GENETIC TESTING:  We discussed with Ms. Franey that her genetic testing was fairly extensive.  If there are genes identified to increase cancer risk that can be analyzed in the future, we would be happy to discuss and coordinate this testing at that time.    CANCER SCREENING RECOMMENDATIONS:  Ms. Barrington test result is considered negative (normal).  This means that we have not identified a hereditary cause for her family history of cancer at this time.   An individual's cancer risk and medical management are not determined by genetic test results alone. Overall cancer risk assessment incorporates additional factors, including personal medical history, family history, and any available genetic information that may result in a  personalized plan for cancer prevention and surveillance. Therefore, it is recommended she continue to follow the cancer management and screening guidelines provided by her primary healthcare provider.  FOLLOW-UP:  Cancer genetics is a rapidly advancing field and it is possible that new genetic tests will be appropriate for her and/or her family members in the future. We encouraged her to remain in contact with cancer genetics on an annual basis so we can update her personal and  family histories and let her know of advances in cancer genetics that may benefit this family.   Our contact number was provided. Ms. Gassert questions were answered to her satisfaction, and she knows she is welcome to call us at anytime with additional questions or concerns.   [name], MS, [facility] Genetic Counselor [email or web address] (P) [phone]
